# Patient Record
Sex: Female | Born: 1950 | Race: White | Hispanic: No | Marital: Single | Smoking: Former smoker
Health system: Southern US, Community
[De-identification: ages and names within clinical notes are randomized; demographics above are authoritative.]

## PROBLEM LIST (undated history)

## (undated) DIAGNOSIS — M810 Age-related osteoporosis without current pathological fracture: Secondary | ICD-10-CM

## (undated) DIAGNOSIS — F32A Depression, unspecified: Secondary | ICD-10-CM

## (undated) DIAGNOSIS — E785 Hyperlipidemia, unspecified: Secondary | ICD-10-CM

## (undated) DIAGNOSIS — F988 Other specified behavioral and emotional disorders with onset usually occurring in childhood and adolescence: Secondary | ICD-10-CM

## (undated) DIAGNOSIS — G629 Polyneuropathy, unspecified: Secondary | ICD-10-CM

## (undated) DIAGNOSIS — F329 Major depressive disorder, single episode, unspecified: Secondary | ICD-10-CM

## (undated) HISTORY — DX: Age-related osteoporosis without current pathological fracture: M81.0

## (undated) HISTORY — DX: Major depressive disorder, single episode, unspecified: F32.9

## (undated) HISTORY — DX: Depression, unspecified: F32.A

## (undated) HISTORY — DX: Hyperlipidemia, unspecified: E78.5

## (undated) HISTORY — PX: TONSILLECTOMY: SUR1361

## (undated) HISTORY — DX: Other specified behavioral and emotional disorders with onset usually occurring in childhood and adolescence: F98.8

## (undated) HISTORY — DX: Polyneuropathy, unspecified: G62.9

---

## 1999-05-21 ENCOUNTER — Encounter: Payer: Self-pay | Admitting: Family Medicine

## 1999-05-21 ENCOUNTER — Ambulatory Visit (HOSPITAL_COMMUNITY): Admission: RE | Admit: 1999-05-21 | Discharge: 1999-05-21 | Payer: Self-pay | Admitting: Family Medicine

## 1999-05-25 ENCOUNTER — Ambulatory Visit (HOSPITAL_COMMUNITY): Admission: RE | Admit: 1999-05-25 | Discharge: 1999-05-25 | Payer: Self-pay | Admitting: Family Medicine

## 1999-05-25 ENCOUNTER — Encounter: Payer: Self-pay | Admitting: Family Medicine

## 1999-07-28 ENCOUNTER — Encounter: Admission: RE | Admit: 1999-07-28 | Discharge: 1999-10-26 | Payer: Self-pay | Admitting: Anesthesiology

## 1999-08-19 ENCOUNTER — Encounter: Payer: Self-pay | Admitting: Neurosurgery

## 1999-08-19 ENCOUNTER — Ambulatory Visit (HOSPITAL_COMMUNITY): Admission: RE | Admit: 1999-08-19 | Discharge: 1999-08-19 | Payer: Self-pay | Admitting: Neurosurgery

## 1999-09-04 ENCOUNTER — Ambulatory Visit (HOSPITAL_COMMUNITY): Admission: RE | Admit: 1999-09-04 | Discharge: 1999-09-04 | Payer: Self-pay | Admitting: Neurosurgery

## 1999-09-10 ENCOUNTER — Encounter: Payer: Self-pay | Admitting: Neurosurgery

## 1999-09-10 ENCOUNTER — Ambulatory Visit (HOSPITAL_COMMUNITY): Admission: RE | Admit: 1999-09-10 | Discharge: 1999-09-10 | Payer: Self-pay | Admitting: Neurosurgery

## 1999-09-29 ENCOUNTER — Encounter: Payer: Self-pay | Admitting: Neurosurgery

## 1999-09-29 ENCOUNTER — Ambulatory Visit (HOSPITAL_COMMUNITY): Admission: RE | Admit: 1999-09-29 | Discharge: 1999-09-29 | Payer: Self-pay | Admitting: Neurosurgery

## 2000-09-07 ENCOUNTER — Encounter: Admission: RE | Admit: 2000-09-07 | Discharge: 2000-09-07 | Payer: Self-pay | Admitting: Family Medicine

## 2000-09-07 ENCOUNTER — Encounter: Payer: Self-pay | Admitting: Family Medicine

## 2002-06-28 ENCOUNTER — Encounter: Payer: Self-pay | Admitting: Emergency Medicine

## 2002-06-28 ENCOUNTER — Emergency Department (HOSPITAL_COMMUNITY): Admission: EM | Admit: 2002-06-28 | Discharge: 2002-06-28 | Payer: Self-pay | Admitting: Emergency Medicine

## 2003-01-03 ENCOUNTER — Encounter: Admission: RE | Admit: 2003-01-03 | Discharge: 2003-04-03 | Payer: Self-pay | Admitting: Neurology

## 2003-09-23 ENCOUNTER — Other Ambulatory Visit: Admission: RE | Admit: 2003-09-23 | Discharge: 2003-09-23 | Payer: Self-pay | Admitting: Family Medicine

## 2004-10-14 ENCOUNTER — Ambulatory Visit (HOSPITAL_COMMUNITY): Admission: RE | Admit: 2004-10-14 | Discharge: 2004-10-14 | Payer: Self-pay | Admitting: Family Medicine

## 2006-05-16 ENCOUNTER — Encounter: Admission: RE | Admit: 2006-05-16 | Discharge: 2006-05-24 | Payer: Self-pay | Admitting: Psychiatry

## 2006-05-16 ENCOUNTER — Ambulatory Visit: Payer: Self-pay | Admitting: Psychology

## 2010-03-22 ENCOUNTER — Encounter: Payer: Self-pay | Admitting: Family Medicine

## 2012-02-07 ENCOUNTER — Ambulatory Visit (INDEPENDENT_AMBULATORY_CARE_PROVIDER_SITE_OTHER): Payer: BC Managed Care – PPO | Admitting: Family Medicine

## 2012-02-07 ENCOUNTER — Ambulatory Visit: Payer: BC Managed Care – PPO

## 2012-02-07 VITALS — BP 103/63 | HR 77 | Temp 98.8°F | Resp 16 | Ht 65.5 in | Wt 155.0 lb

## 2012-02-07 DIAGNOSIS — M25531 Pain in right wrist: Secondary | ICD-10-CM

## 2012-02-07 DIAGNOSIS — S52599A Other fractures of lower end of unspecified radius, initial encounter for closed fracture: Secondary | ICD-10-CM

## 2012-02-07 DIAGNOSIS — S52509A Unspecified fracture of the lower end of unspecified radius, initial encounter for closed fracture: Secondary | ICD-10-CM

## 2012-02-07 DIAGNOSIS — M25539 Pain in unspecified wrist: Secondary | ICD-10-CM

## 2012-02-07 NOTE — Progress Notes (Signed)
Subjective: 61 year old lady who was in [place] for a wedding this weekend. Her daughter got married. After the wedding she slipped on a wet floor and fell back with her left hand and her. She has pain in her wrist. She was seen by a physician and everything was felt to be in place. It was suspicious for fracture. She chose to return to the [place] to get checked. She did wear a wrist splint.  Objective: Is intact. She is most tender over the distal radius and the carpal bones surrounding that.  Assessment: Suspicious for left wrist fracture  Plan: X-ray and proceed accordingly  [facility] reading (PRIMARY) by  Dr. Alwyn Ren Fractured distal radius, nondisplaced. I do not think it is articular, but will await radiologist reading on that.  Sugar tongs splint. Return one week.  Will change to a cast next week, and treat her for about 6 weeks.

## 2012-02-07 NOTE — Patient Instructions (Addendum)
Ice and elevate for the next several days to reduce swelling. Use the ice for about 15-20 minutes 4 times a day.  Ibuprofen 600 mg every 6 or 8 hours as needed for pain  Return if worse  Recheck on Monday the December 16 between 9 and 3

## 2012-02-14 ENCOUNTER — Ambulatory Visit: Payer: BC Managed Care – PPO

## 2012-02-14 ENCOUNTER — Ambulatory Visit (INDEPENDENT_AMBULATORY_CARE_PROVIDER_SITE_OTHER): Payer: BC Managed Care – PPO | Admitting: Family Medicine

## 2012-02-14 VITALS — BP 117/73 | HR 74 | Temp 98.0°F | Resp 17 | Ht 65.0 in | Wt 153.0 lb

## 2012-02-14 DIAGNOSIS — S62102A Fracture of unspecified carpal bone, left wrist, initial encounter for closed fracture: Secondary | ICD-10-CM

## 2012-02-14 DIAGNOSIS — S62109A Fracture of unspecified carpal bone, unspecified wrist, initial encounter for closed fracture: Secondary | ICD-10-CM

## 2012-02-14 NOTE — Patient Instructions (Addendum)
Appt has been made with Dr Mack Hook at [facility] 3:15 today for your wrist fracture.

## 2012-02-14 NOTE — Progress Notes (Signed)
Subjective Patient is here for recheck with regard to her wrist. She's had some irritation underneath the splint. She has had a little swelling in the fingers and discomfort. She took some pain medicine at home.  Objective: Tender in the distal left wrist. I removed the splint to get a good look. She has ecchymosis laterally along the forearm. No positional abnormalities neurovascular intact. Finger motion is a little stiff but normal.  [facility] reading (PRIMARY) by  Dr. Alwyn Ren fx unchanged.   Assessment: Fracture distal radius, possible joint involvement.  Plan: Gave her the option of seeing an orthopedist and she thinks she would prefer that, so we are sending her to see Dr. Mariel Sleet orthopedics. She is happy with that. Rewrapped her splint

## 2012-03-11 ENCOUNTER — Ambulatory Visit: Payer: BC Managed Care – PPO

## 2012-03-11 ENCOUNTER — Ambulatory Visit (INDEPENDENT_AMBULATORY_CARE_PROVIDER_SITE_OTHER): Payer: BC Managed Care – PPO | Admitting: Family Medicine

## 2012-03-11 VITALS — BP 120/72 | HR 85 | Temp 98.2°F | Resp 16 | Ht 65.0 in | Wt 151.0 lb

## 2012-03-11 DIAGNOSIS — M25532 Pain in left wrist: Secondary | ICD-10-CM

## 2012-03-11 DIAGNOSIS — M25539 Pain in unspecified wrist: Secondary | ICD-10-CM

## 2012-03-11 DIAGNOSIS — M25529 Pain in unspecified elbow: Secondary | ICD-10-CM

## 2012-03-11 DIAGNOSIS — M25522 Pain in left elbow: Secondary | ICD-10-CM

## 2012-03-11 NOTE — Progress Notes (Signed)
[facility]:  Office Visit  Chief Complaint:  Chief Complaint  Patient presents with  . Elbow Injury    Lt elbow pain- Woman fell on Pt's arm at movie theater today about 3:10pm    HPI: Jennifer Heath is a 62 y.o. female who complains of  Left elbow pain, wrist pain after a woman fell on her today, was at the movies at 3 pm. She was sitting when a lady tripped walking down the theater stairs and landed on her left side. She immediately started ahving elbow and wrist pain, also cannot move her fingers as well as she did yesterday. Now has numbness/tingling in her wrist and elbow pain.   Past Medical History  Diagnosis Date  . Depression    No past surgical history on file. History   Social History  . Marital Status: Married    Spouse Name: N/A    Number of Children: N/A  . Years of Education: N/A   Social History Main Topics  . Smoking status: Never Smoker   . Smokeless tobacco: None  . Alcohol Use: Yes  . Drug Use: No  . Sexually Active: No   Other Topics Concern  . None   Social History Narrative  . None   Family History  Problem Relation Age of Onset  . Cancer Mother   . Cancer Father   . Stroke Father   . Heart disease Father   . Cancer Brother   . Cancer Maternal Grandmother   . Heart disease Maternal Grandfather   . Heart disease Paternal Grandmother   . Cancer Paternal Grandfather    Allergies  Allergen Reactions  . Erythromycin    Prior to Admission medications   Medication Sig Start Date End Date Taking? Authorizing Provider  ibuprofen (ADVIL,MOTRIN) 400 MG tablet Take 400 mg by mouth as needed.   Yes Historical Provider, MD  lisdexamfetamine (VYVANSE) 40 MG capsule Take 40 mg by mouth every morning.    Historical Provider, MD     ROS: The patient denies fevers, chills, night sweats, unintentional weight loss, chest pain, palpitations, wheezing, dyspnea on exertion, nausea, vomiting, abdominal pain, dysuria, hematuria, melena,  numbness, weakness, or tingling.   All other systems have been reviewed and were otherwise negative with the exception of those mentioned in the HPI and as above.    PHYSICAL EXAM: Filed Vitals:   03/11/12 1701  BP: 120/72  Pulse: 85  Temp: 98.2 F (36.8 C)  Resp: 16   Filed Vitals:   03/11/12 1701  Height: 5\' 5"  (1.651 m)  Weight: 151 lb (68.493 kg)   Body mass index is 25.13 kg/(m^2).  General: Alert, no acute distress HEENT:  Normocephalic, atraumatic, oropharynx patent.  Cardiovascular:  Regular rate and rhythm, no rubs murmurs or gallops.  No Carotid bruits, radial pulse intact. No pedal edema.  Respiratory: Clear to auscultation bilaterally.  No wheezes, rales, or rhonchi.  No cyanosis, no use of accessory musculature GI: No organomegaly, abdomen is soft and non-tender, positive bowel sounds.  No masses. Skin: No rashes. Neurologic: Facial musculature symmetric. Psychiatric: Patient is appropriate throughout our interaction. Lymphatic: No cervical lymphadenopathy Musculoskeletal: Gait intact. Left  elbow-tender to palpation, 5/5 strength, sensation intact, decrease  ROM in all directions Left wrist-decrease ROM due to pain and also in brace, sensation intact, finger strength intact  LABS: No results found for this or any previous visit.   EKG/XRAY:   Primary read interpreted by [facility] Conley Rolls at [facility].  Elbow-No obvious dislocation. I do not see a posterior fat pad but she has severe pain and cannot supinate/pronate very well.? Occult radial head fx Wrist-distal radius fracture healing well, stable.    ASSESSMENT/PLAN: Encounter Diagnoses  Name Primary?  . Left elbow pain Yes  . Left wrist pain    ? Occult elbow fracture Currently nothing obvious Will await radiology results Sling, immobilize until get official xray report Tramdol and ibuprofen F/u with Dr. Maisie Fus in 1 week    Marcille Barman PHUONG, DO 03/11/2012 6:09 PM

## 2012-03-12 ENCOUNTER — Telehealth: Payer: Self-pay | Admitting: Family Medicine

## 2012-03-12 NOTE — Telephone Encounter (Signed)
Elbow and arm doing better. D/w pt official radiology results.

## 2013-01-12 ENCOUNTER — Encounter (HOSPITAL_COMMUNITY): Payer: Self-pay | Admitting: Emergency Medicine

## 2013-01-12 ENCOUNTER — Emergency Department (HOSPITAL_COMMUNITY)
Admission: EM | Admit: 2013-01-12 | Discharge: 2013-01-12 | Disposition: A | Discharge status: Home / Self Care | Payer: BC Managed Care – PPO | Attending: Emergency Medicine | Admitting: Emergency Medicine

## 2013-01-12 ENCOUNTER — Emergency Department (HOSPITAL_COMMUNITY): Payer: BC Managed Care – PPO

## 2013-01-12 DIAGNOSIS — Z79899 Other long term (current) drug therapy: Secondary | ICD-10-CM | POA: Insufficient documentation

## 2013-01-12 DIAGNOSIS — F3289 Other specified depressive episodes: Secondary | ICD-10-CM | POA: Insufficient documentation

## 2013-01-12 DIAGNOSIS — S82002A Unspecified fracture of left patella, initial encounter for closed fracture: Secondary | ICD-10-CM

## 2013-01-12 DIAGNOSIS — F329 Major depressive disorder, single episode, unspecified: Secondary | ICD-10-CM | POA: Insufficient documentation

## 2013-01-12 DIAGNOSIS — S92253A Displaced fracture of navicular [scaphoid] of unspecified foot, initial encounter for closed fracture: Secondary | ICD-10-CM | POA: Insufficient documentation

## 2013-01-12 DIAGNOSIS — S92251A Displaced fracture of navicular [scaphoid] of right foot, initial encounter for closed fracture: Secondary | ICD-10-CM

## 2013-01-12 DIAGNOSIS — Y939 Activity, unspecified: Secondary | ICD-10-CM | POA: Insufficient documentation

## 2013-01-12 DIAGNOSIS — Y929 Unspecified place or not applicable: Secondary | ICD-10-CM | POA: Insufficient documentation

## 2013-01-12 DIAGNOSIS — W010XXA Fall on same level from slipping, tripping and stumbling without subsequent striking against object, initial encounter: Secondary | ICD-10-CM | POA: Insufficient documentation

## 2013-01-12 DIAGNOSIS — S82009A Unspecified fracture of unspecified patella, initial encounter for closed fracture: Secondary | ICD-10-CM | POA: Insufficient documentation

## 2013-01-12 MED ORDER — IBUPROFEN 800 MG PO TABS
800.0000 mg | ORAL_TABLET | Freq: Once | ORAL | Status: AC
  Administered 2013-01-12: 800 mg via ORAL
  Filled 2013-01-12: qty 1

## 2013-01-12 MED ORDER — OXYCODONE-ACETAMINOPHEN 5-325 MG PO TABS: 2.0000 | ORAL_TABLET | ORAL | Status: DC | PRN

## 2013-01-12 NOTE — ED Notes (Signed)
Bed: WA06 Expected date:  Expected time:  Means of arrival:  Comments: EMS-left knee pain

## 2013-01-12 NOTE — ED Provider Notes (Signed)
CSN: [id number]     Arrival date & time 01/12/13  1047 History   First MD Initiated Contact with Patient 01/12/13 1051     Chief Complaint  Patient presents with  . Knee Injury   (Consider location/radiation/quality/duration/timing/severity/associated sxs/prior Treatment) Patient is a 62 y.o. female presenting with fall. The history is provided by the patient. No language interpreter was used.  Fall This is a new problem. The current episode started today. The problem occurs constantly. The problem has been unchanged. Associated symptoms include joint swelling and myalgias. Nothing aggravates the symptoms. She has tried nothing for the symptoms.  Pt slipped and fell on wet leaves.   Pt reports she hit her right knee.  Pt reports she turned her ankles.   Pt reports both ankles are sore now.    Past Medical History  Diagnosis Date  . Depression    No past surgical history on file. Family History  Problem Relation Age of Onset  . Cancer Mother   . Cancer Father   . Stroke Father   . Heart disease Father   . Cancer Brother   . Cancer Maternal Grandmother   . Heart disease Maternal Grandfather   . Heart disease Paternal Grandmother   . Cancer Paternal Grandfather    History  Substance Use Topics  . Smoking status: Never Smoker   . Smokeless tobacco: Not on file  . Alcohol Use: Yes   OB History   Grav Para Term Preterm Abortions TAB SAB Ect Mult Living                 Review of Systems  Musculoskeletal: Positive for joint swelling and myalgias.  All other systems reviewed and are negative.    Allergies  Erythromycin  Home Medications   Current Outpatient Rx  Name  Route  Sig  Dispense  Refill  . ibuprofen (ADVIL,MOTRIN) 400 MG tablet   Oral   Take 400 mg by mouth as needed.         Marland Kitchen lisdexamfetamine (VYVANSE) 40 MG capsule   Oral   Take 40 mg by mouth every morning.          SpO2 99% Physical Exam  Nursing note and vitals reviewed. Constitutional: She  is oriented to person, place, and time. She appears well-developed and well-nourished.  HENT:  Head: Normocephalic and atraumatic.  Eyes: Pupils are equal, round, and reactive to light.  Pulmonary/Chest: Effort normal.  Musculoskeletal: She exhibits tenderness.  Tender right knee,  Swollen,    Tender bilat ankles,  From,  nv and ns intact  Neurological: She is alert and oriented to person, place, and time. She has normal reflexes.  Skin: Skin is warm.  Psychiatric: She has a normal mood and affect.    ED Course  Procedures (including critical care time) Labs Review Labs Reviewed - No data to display Imaging Review No results found.  EKG Interpretation   None       MDM   1. Patella fracture, left, closed, initial encounter   2. Navicular fracture of ankle, right, closed, initial encounter    I  I reviewed xrays.   I spoke to Dr. Margreta Journey who will see pt next week.  He advised ice, imbolizer,  See him in office.   Pt also placed in aso.      Lonia Skinner [place], PA-C 01/12/13 1250

## 2013-01-12 NOTE — ED Provider Notes (Signed)
Medical screening examination/treatment/procedure(s) were performed by non-physician practitioner and as supervising physician I was immediately available for consultation/collaboration.  EKG Interpretation   None       Devoria Albe, MD, Armando Gang   Ward Givens, MD 01/12/13 [phone]

## 2013-01-12 NOTE — ED Notes (Signed)
PA at bedside.

## 2013-01-12 NOTE — ED Notes (Addendum)
Patient reports to ED for left knee injury s/t slipping on stairs covered in leaves, turning ankle, falling, and twisting left knee. Per EMS patient was able to limp onto stretcher. No swelling or obvious deformity visible.

## 2013-07-31 ENCOUNTER — Other Ambulatory Visit: Payer: Self-pay | Admitting: Family Medicine

## 2013-07-31 ENCOUNTER — Other Ambulatory Visit (HOSPITAL_COMMUNITY)
Admission: RE | Admit: 2013-07-31 | Discharge: 2013-07-31 | Disposition: A | Discharge status: Home / Self Care | Payer: BC Managed Care – PPO | Source: Ambulatory Visit | Attending: Family Medicine | Admitting: Family Medicine

## 2013-07-31 DIAGNOSIS — Z0000 Encounter for general adult medical examination without abnormal findings: Secondary | ICD-10-CM | POA: Insufficient documentation

## 2013-08-02 LAB — CYTOLOGY - PAP

## 2013-12-29 ENCOUNTER — Ambulatory Visit (INDEPENDENT_AMBULATORY_CARE_PROVIDER_SITE_OTHER): Payer: BC Managed Care – PPO | Admitting: Emergency Medicine

## 2013-12-29 VITALS — BP 105/68 | HR 109 | Temp 99.0°F | Resp 20 | Ht 65.0 in | Wt 150.4 lb

## 2013-12-29 DIAGNOSIS — J111 Influenza due to unidentified influenza virus with other respiratory manifestations: Secondary | ICD-10-CM

## 2013-12-29 DIAGNOSIS — Z881 Allergy status to other antibiotic agents status: Secondary | ICD-10-CM

## 2013-12-29 DIAGNOSIS — R69 Illness, unspecified: Principal | ICD-10-CM

## 2013-12-29 MED ORDER — OSELTAMIVIR PHOSPHATE 75 MG PO CAPS: 75.0000 mg | ORAL_CAPSULE | Freq: Two times a day (BID) | ORAL | Status: DC

## 2013-12-29 MED ORDER — PROMETHAZINE-CODEINE 6.25-10 MG/5ML PO SYRP: 5.0000 mL | ORAL_SOLUTION | ORAL | Status: DC | PRN

## 2013-12-29 NOTE — Patient Instructions (Signed)

## 2013-12-29 NOTE — Progress Notes (Signed)
[facility] [address], [place] [id number] [phone]  Date:  12/29/2013   Name:  Jennifer Heath   DOB:  [birth year]   MRN:  [id number]  PCP:  No primary provider on file.    Chief Complaint: Influenza   History of Present Illness:  Jennifer Heath is a 63 y.o. very pleasant female patient who presents with the following:  Ill since Thursday night and has fever and chills that was sudden onset.  Has a cough, no wheezing but some exertional shortness of breath.   Some nasal congestion with clear drainage Fever to 102.    Sore throat and hoarseness No nausea or vomiting No stool change Has a generalized erythematous rash secondary to treatment of paronychia with keflex. No improvement with over the counter medications or other home remedies. Denies other complaint or health concern today.   There are no active problems to display for this patient.   Past Medical History  Diagnosis Date  . Depression     No past surgical history on file.  History  Substance Use Topics  . Smoking status: Never Smoker   . Smokeless tobacco: Not on file  . Alcohol Use: Yes    Family History  Problem Relation Age of Onset  . Cancer Mother   . Cancer Father   . Stroke Father   . Heart disease Father   . Cancer Brother   . Cancer Maternal Grandmother   . Heart disease Maternal Grandfather   . Heart disease Paternal Grandmother   . Cancer Paternal Grandfather     Allergies  Allergen Reactions  . Azithromycin Hives  . Erythromycin Nausea Only    Medication list has been reviewed and updated.  Current Outpatient Prescriptions on File Prior to Visit  Medication Sig Dispense Refill  . ibuprofen (ADVIL,MOTRIN) 400 MG tablet Take 400 mg by mouth as needed.      Marland Kitchen. lisdexamfetamine (VYVANSE) 40 MG capsule Take 40 mg by mouth every morning.       No current facility-administered medications on file prior to visit.    Review of Systems:  As per HPI,  otherwise negative.    Physical Examination: Filed Vitals:   12/29/13 1324  BP: 105/68  Pulse: 109  Temp: 99 F (37.2 C)  Resp: 20   Filed Vitals:   12/29/13 1324  Height: 5\' 5"  (1.651 m)  Weight: 150 lb 6.4 oz (68.221 kg)   Body mass index is 25.03 kg/(m^2). Ideal Body Weight: Weight in (lb) to have BMI = 25: 149.9  GEN: WDWN, NAD, Non-toxic, A & O x 3 HEENT: Atraumatic, Normocephalic. Neck supple. No masses, No LAD. Ears and Nose: No external deformity. CV: RRR, No M/G/R. No JVD. No thrill. No extra heart sounds. PULM: CTA B, no wheezes, crackles, rhonchi. No retractions. No resp. distress. No accessory muscle use. ABD: S, NT, ND, +BS. No rebound. No HSM. EXTR: No c/c/e NEURO Normal gait.  PSYCH: Normally interactive. Conversant. Not depressed or anxious appearing.  Calm demeanor.  SKIN: urticaria   Assessment and Plan: Allergic reaction to keflex Influenza like illness tamiflu Benadryl Phen c cod  Signed,  Phillips OdorJeffery Marston Mccadden, MD

## 2014-08-26 ENCOUNTER — Other Ambulatory Visit: Payer: Self-pay

## 2015-07-25 ENCOUNTER — Ambulatory Visit (INDEPENDENT_AMBULATORY_CARE_PROVIDER_SITE_OTHER): Payer: Medicare Other | Admitting: Emergency Medicine

## 2015-07-25 VITALS — BP 122/72 | HR 80 | Temp 97.4°F | Resp 17 | Ht 64.5 in | Wt 154.0 lb

## 2015-07-25 DIAGNOSIS — R221 Localized swelling, mass and lump, neck: Secondary | ICD-10-CM | POA: Diagnosis not present

## 2015-07-25 DIAGNOSIS — W57XXXA Bitten or stung by nonvenomous insect and other nonvenomous arthropods, initial encounter: Secondary | ICD-10-CM

## 2015-07-25 DIAGNOSIS — S1086XA Insect bite of other specified part of neck, initial encounter: Secondary | ICD-10-CM | POA: Diagnosis not present

## 2015-07-25 LAB — POCT CBC
Granulocyte percent: 64.9 %G (ref 37–80)
HCT, POC: 37.8 % (ref 37.7–47.9)
Hemoglobin: 13.3 g/dL (ref 12.2–16.2)
Lymph, poc: 2.5 (ref 0.6–3.4)
MCH, POC: 31.2 pg (ref 27–31.2)
MCHC: 35.2 g/dL (ref 31.8–35.4)
MCV: 88.7 fL (ref 80–97)
MID (CBC): 0.4 (ref 0–0.9)
MPV: 7.1 fL (ref 0–99.8)
POC Granulocyte: 5.3 (ref 2–6.9)
POC LYMPH %: 30.3 % (ref 10–50)
POC MID %: 4.8 %M (ref 0–12)
Platelet Count, POC: 280 10*3/uL (ref 142–424)
RBC: 4.26 M/uL (ref 4.04–5.48)
RDW, POC: 13.4 %
WBC: 8.2 10*3/uL (ref 4.6–10.2)

## 2015-07-25 MED ORDER — BETAMETHASONE DIPROPIONATE AUG 0.05 % EX CREA: TOPICAL_CREAM | Freq: Two times a day (BID) | CUTANEOUS | Status: DC

## 2015-07-25 NOTE — Progress Notes (Deleted)
    MRN: [id number] DOB: 1951-02-11  Subjective:   Jennifer Heath is a 65 y.o. female presenting for chief complaint of Insect Bite     Jennifer Heath has a current medication list which includes the following prescription(s): ibuprofen, lisdexamfetamine, and pravastatin. Also is allergic to azithromycin and erythromycin.  Jennifer Heath  has a past medical history of Depression. Also  has no past surgical history on file.  Objective:   Vitals: BP 122/72 mmHg  Pulse 80  Temp(Src) 97.4 F (36.3 C) (Oral)  Resp 17  Ht 5' 4.5" (1.638 m)  Wt 154 lb (69.854 kg)  BMI 26.04 kg/m2  SpO2 99%  Physical Exam  No results found for this or any previous visit (from the past 24 hour(s)).  Assessment and Plan :     Wallis BambergMario Tandrea Kommer, PA-C [facility] [phone] 07/25/2015 10:18 AM

## 2015-07-25 NOTE — Progress Notes (Signed)
By signing my name below, I, Raven Small, attest that this documentation has been prepared under the direction and in the presence of Lesle Chris, MD.  Electronically Signed: Andrew Au, ED Scribe. 07/25/2015. 10:47 AM.  Chief Complaint:  Chief Complaint  Patient presents with  . Insect Bite    on neck     HPI: Jennifer Heath is a 65 y.o. female who reports to [facility] today complaining of insect bite to neck noticed about 1 week ago. Pt states she had been working in the yard. She states she had swelling the to neck the size of a ping pong ball with itching but no redness. She still has some swelling but has improved since last week. She also reports headache for about 1 week since noticing the bite. She denies applying cream or ointment to area. She denies fever and chills.    Past Medical History  Diagnosis Date  . Depression    No past surgical history on file. Social History   Social History  . Marital Status: Married    Spouse Name: N/A  . Number of Children: N/A  . Years of Education: N/A   Social History Main Topics  . Smoking status: Never Smoker   . Smokeless tobacco: None  . Alcohol Use: Yes  . Drug Use: No  . Sexual Activity: No   Other Topics Concern  . None   Social History Narrative   Family History  Problem Relation Age of Onset  . Cancer Mother   . Cancer Father   . Stroke Father   . Heart disease Father   . Cancer Brother   . Cancer Maternal Grandmother   . Heart disease Maternal Grandfather   . Heart disease Paternal Grandmother   . Cancer Paternal Grandfather    Allergies  Allergen Reactions  . Azithromycin Hives  . Erythromycin Nausea Only   Prior to Admission medications   Medication Sig Start Date End Date Taking? Authorizing Provider  ibuprofen (ADVIL,MOTRIN) 400 MG tablet Take 400 mg by mouth as needed.   Yes Historical Provider, MD  lisdexamfetamine (VYVANSE) 40 MG capsule Take 40 mg by mouth every morning.   Yes Historical  Provider, MD  pravastatin (PRAVACHOL) 10 MG tablet Take 10 mg by mouth daily.   Yes Historical Provider, MD     ROS: The patient denies fevers, chills, night sweats, unintentional weight loss, chest pain, palpitations, wheezing, dyspnea on exertion, nausea, vomiting, abdominal pain, dysuria, hematuria, melena, numbness, weakness, or tingling.   All other systems have been reviewed and were otherwise negative with the exception of those mentioned in the HPI and as above.    PHYSICAL EXAM: Filed Vitals:   07/25/15 0928  BP: 122/72  Pulse: 80  Temp: 97.4 F (36.3 C)  Resp: 17   Body mass index is 26.04 kg/(m^2).   General: Alert, no acute distress HEENT:  Normocephalic, atraumatic, oropharynx patent. No nodes. No masses.  Eye: Nonie Hoyer [facility] Cardiovascular:  Regular rate and rhythm, no rubs murmurs or gallops.  No Carotid bruits, radial pulse intact. No pedal edema.  Respiratory: Clear to auscultation bilaterally.  No wheezes, rales, or rhonchi.  No cyanosis, no use of accessory musculature Abdominal: No organomegaly, abdomen is soft and non-tender, positive bowel sounds.  No masses. Musculoskeletal: Gait intact. No edema, tenderness Skin: No rashes. Very mild redness with swelling on the left side of the neck.  Neurologic: Facial musculature symmetric. Psychiatric: Patient acts appropriately throughout our interaction. Lymphatic: No  cervical or submandibular lymphadenopathy   LABS: Results for orders placed or performed in visit on 07/25/15  POCT CBC  Result Value Ref Range   WBC 8.2 4.6 - 10.2 K/uL   Lymph, poc 2.5 0.6 - 3.4   POC LYMPH PERCENT 30.3 10 - 50 %L   MID (cbc) 0.4 0 - 0.9   POC MID % 4.8 0 - 12 %M   POC Granulocyte 5.3 2 - 6.9   Granulocyte percent 64.9 37 - 80 %G   RBC 4.26 4.04 - 5.48 M/uL   Hemoglobin 13.3 12.2 - 16.2 g/dL   HCT, POC 40.937.8 81.137.7 - 47.9 %   MCV 88.7 80 - 97 fL   MCH, POC 31.2 27 - 31.2 pg   MCHC 35.2 31.8 - 35.4 g/dL   RDW, POC 91.413.4 %    Platelet Count, POC 280 142 - 424 K/uL   MPV 7.1 0 - 99.8 fL   ASSESSMENT/PLAN: Patient having a prolonged reaction to an insect bite. I advised her to take Zyrtec one daily. She will apply Diprolene cream 3 times a day.I personally performed the services described in this documentation, which was scribed in my presence. The recorded information has been reviewed and is accurate.   Gross sideeffects, risk and benefits, and alternatives of medications d/w patient. Patient is aware that all medications have potential sideeffects and we are unable to predict every sideeffect or drug-drug interaction that may occur.  Lesle ChrisSteven Havah Ammon MD 07/25/2015 10:47 AM

## 2015-07-25 NOTE — Patient Instructions (Addendum)
Apply cream twice a day. Take Zyrtec one daily for itching.    IF you received an x-ray today, you will receive an invoice from [facility]. Please contact [facility] at [phone] with questions or concerns regarding your invoice.   IF you received labwork today, you will receive an invoice from [facility]. Please contact [facility] at [phone] with questions or concerns regarding your invoice.   Our billing staff will not be able to assist you with questions regarding bills from these companies.  You will be contacted with the lab results as soon as they are available. The fastest way to get your results is to activate your My Chart account. Instructions are located on the last page of this paperwork. If you have not heard from us regarding the results in 2 weeks, please contact this office.

## 2015-08-19 ENCOUNTER — Ambulatory Visit (INDEPENDENT_AMBULATORY_CARE_PROVIDER_SITE_OTHER): Payer: Medicare Other | Admitting: Neurology

## 2015-08-19 ENCOUNTER — Encounter: Payer: Self-pay | Admitting: Neurology

## 2015-08-19 VITALS — BP 107/69 | HR 83 | Ht 64.5 in | Wt 152.4 lb

## 2015-08-19 DIAGNOSIS — F411 Generalized anxiety disorder: Secondary | ICD-10-CM | POA: Diagnosis not present

## 2015-08-19 DIAGNOSIS — F909 Attention-deficit hyperactivity disorder, unspecified type: Secondary | ICD-10-CM

## 2015-08-19 DIAGNOSIS — R42 Dizziness and giddiness: Secondary | ICD-10-CM | POA: Diagnosis not present

## 2015-08-19 DIAGNOSIS — F05 Delirium due to known physiological condition: Secondary | ICD-10-CM

## 2015-08-19 DIAGNOSIS — R4789 Other speech disturbances: Secondary | ICD-10-CM

## 2015-08-19 DIAGNOSIS — F988 Other specified behavioral and emotional disorders with onset usually occurring in childhood and adolescence: Secondary | ICD-10-CM | POA: Insufficient documentation

## 2015-08-19 DIAGNOSIS — F329 Major depressive disorder, single episode, unspecified: Secondary | ICD-10-CM | POA: Diagnosis not present

## 2015-08-19 DIAGNOSIS — F32A Depression, unspecified: Secondary | ICD-10-CM

## 2015-08-19 DIAGNOSIS — R413 Other amnesia: Secondary | ICD-10-CM | POA: Diagnosis not present

## 2015-08-19 DIAGNOSIS — R410 Disorientation, unspecified: Secondary | ICD-10-CM

## 2015-08-19 NOTE — Progress Notes (Signed)
[facility]    Provider:  Dr Lucia GaskinsAhern Referring Provider: Merri BrunetteSmith, Candace, MD Primary Care Physician:  Merri BrunetteSmith, Candace  CC:  Atrophy of the brain  HPI:  Jennifer Heath is a 65 y.o. female here as a referral from Dr. Katrinka BlazingSmith for history of abnormal MRI in the past showing "brain shrinkage" per patient. PMHx hld, ADD, memory changes, fatigue. She reports she hit her head in the past, she was having symptoms of cognitive changes and she was told she had a shrunken brain after MRI and evaluation in our office. She cried when she heard that and recently a friend died of dementia and she remembered the MRI and wanted to follow up. She was asked in the past about alcohol use. She does not have a significant history of alcohol use. This worries her. She found some documents some office notes when she saw our practice in 2004 and brought them with her(reviewed below). A close friend passed away recently, from dementia. She is worried about dementia. She is having memory problems which have been ongoing for years. She also has depression. She has been driving back and forth to [place], that has been stressful, staying with daughter and son in law is hard. "Everything is hard", such as decision making process. She is moving slower. She jogs every morning and she is slower at home. She is not able to express herself. She has to go back and see the news a few times, she can;t absorb information as well. She lives alone, she is worried about dementia. In the last year, she is having more difficulty with finances, she is in debt on her credit, she is missing bills. She has been dizzy as well. She had neurocognitive testing in the past in 2004.   Reviewed notes, labs and imaging from outside physicians, which showed:  Reviewed labs from pcp, CMP normal with creatinine 0.77 08/07/2015, cbc normal. b12 443, tsh 2.25, ldl 106, hgba1c 5.4.  Reviewed pcp notes. She has a history of ADD on vyvanse. In  2003/2004 she fell down and had head injury and MRi showed "brain shrinkage", father with dementia, she has dizziness and fatigue, harder time with trouble thinking, processing speed, trouble putting things into words. Reviewed EPIC, could not find any previous brain imaging or notes from neurology, looked in old system centricity also no notes. Reviewed notes in EPIC, none significant for chief complaint, was seen in the ED 12/2012 for left knee injury, in 11/2013 for the flu, in 06/2015 for an insect bite, no mention of memory changes or concerns from physicians seeing her.   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 Patient was seen in September 2004 for intermittent dizziness as well as cognitive impairment since a closed head injury on 06/28/2002. Patient had fallen down at home rolled over 6 steps and landed at the base in the back of her head. She did not lose consciousness or have any skull fracture or bleeding. Since then she's been describing dizziness. Intermittent vertigo as well particularly when turning her head when bending down to pick up objects. CAT scan was  Unremarkable.Meclizine made her feel sleepy and drug. They also tried maneuvers in the office which only helped her a little bit. She also reported cognitive difficulties since the accident increased difficulty with analyzing information,poor concentration as well as multitasking, short-term memory also quite poor.  Even prior to the accident she was concerned that she had mild attention deficit problems, as well as depression for which she was treated on Wellbutrin. Concerta helped her. 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 She continued to have slow thinking, poor memory as well as depression. Vestibular therapy was recommended.3 months later she was seen again with intermittent dizziness, not compliant with home vestibular stabilization exercises, continued to have cognitive difficulty predominantly related to short-term memory problems.She was referred for neuropsych testing. Review of Systems: Patient complains of symptoms per HPI as well as the following symptoms: fatigue, hearing loss, ringing in ears, memory loss, headache, dizziness, depression, not enough sleep, decreased energy.  Pertinent negatives per HPI. All others negative.   Social History   Social History  . Marital Status: Single    Spouse Name: N/A  . Number of Children: 2  . Years of Education: 16   Occupational History  . 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 Smoking status: Former Smoker    Quit date: 03/02/1979  . Smokeless tobacco: Not on file  . Alcohol Use: 0.0 oz/week    0 Standard drinks or equivalent per week  . Drug Use: No  . Sexual Activity: No   Other Topics Concern  . Not on file   Social History Narrative   Lives alone   Caffeine use: very little    Family History  Problem Relation Age of Onset  . Cancer Mother   . Cancer Father   . Stroke Father   . Heart disease Father   . Cancer Brother   . Cancer Maternal Grandmother   . Heart disease Maternal Grandfather   . Heart disease Paternal Grandmother   . Cancer Paternal Grandfather   . Dementia Neg Hx     Past Medical History  Diagnosis Date  . Depression   . ADD (attention deficit disorder)   . Osteoporosis   . Hyperlipidemia     Past Surgical History  Procedure Laterality Date  . Tonsillectomy      Current Outpatient  Prescriptions  Medication Sig Dispense Refill  . augmented betamethasone dipropionate (DIPROLENE AF) 0.05 % cream Apply topically 2 (two) times daily. 30 g 0  . ibuprofen (ADVIL,MOTRIN) 400 MG tablet Take 400 mg by mouth as needed.    Marland Kitchen lisdexamfetamine (VYVANSE) 40 MG capsule Take 40 mg by mouth every morning.    . pravastatin (PRAVACHOL) 20 MG tablet Take 20 mg by mouth daily.     No current facility-administered medications for this visit.    Allergies as of 08/19/2015 - Review Complete 08/19/2015  Allergen Reaction Noted  . Azithromycin Hives 01/12/2013  . Erythromycin Nausea Only 02/07/2012    Vitals: BP 107/69 mmHg  Pulse 83  Ht 5' 4.5" (1.638 m)  Wt 152 lb 6.4 oz (69.128 kg)  BMI 25.76 kg/m2 Last Weight:  Wt Readings from Last 1 Encounters:  08/19/15 152 lb 6.4 oz (69.128 kg)   Last Height:   Ht Readings from Last 1 Encounters:  08/19/15 5' 4.5" (1.638 m)   Physical exam: Exam: Gen: NAD, conversant, well nourised, well groomed  CV: RRR, no MRG. No Carotid Bruits. No peripheral edema, warm, nontender Eyes: Conjunctivae clear without exudates or hemorrhage  Neuro: Detailed Neurologic Exam  Speech:    Speech is normal; fluent and spontaneous with normal comprehension.  Cognition:    The patient is oriented to person, place, and time;     recent and remote memory intact;     language fluent;     normal attention, concentration,     fund of knowledge Cranial Nerves:    The pupils are equal, round, and reactive to light. The fundi are normal and spontaneous venous pulsations are present. Visual fields are full to finger confrontation. Extraocular movements are intact. Trigeminal sensation is intact and the muscles of mastication are normal. The face is symmetric. The palate elevates in the midline. Hearing intact. Voice is normal. Shoulder shrug is normal. The tongue has normal motion without fasciculations.   Coordination:    Normal finger to  nose and heel to shin. Normal rapid alternating movements.   Gait:    Heel-toe and tandem gait are normal.   Motor Observation:    No asymmetry, no atrophy, and no involuntary movements noted. Tone:    Normal muscle tone.    Posture:    Posture is normal. normal erect    Strength:    Strength is V/V in the upper and lower limbs.      Sensation: intact to LT     Reflex Exam:  DTR's:    Deep tendon reflexes in the upper and lower extremities are normal bilaterally.   Toes:    The toes are downgoing bilaterally.   Clonus:    Clonus is absent.       Assessment/Plan:   Jennifer Heath is a 65 y.o. female here as a referral from Dr. Katrinka Blazing for history of abnormal MRI in the past maybe showing atrophy(cannot find the images or a report). PMHx hld, ADD, memory changes, fatigue, depression and anxiety. Patient is reporting what appears to be a long history  of perceived cognitive problems. She was diagnosed in 2004 with cognitive changes associated with post-concussive Syndrome with a  contribution from her depression and attention disorder.   MMSE 30/30 today. Patient appears to have a long history of perceived cognitive disorder, likely multifactorial due to mood and attention deficit disorder,Normal cognitive aging, anxiety - very unlikely degenerative neurocognitive disease. Will repeat MRI of the brain. Neurocognitive testing ordered  Naomie Dean, MD  [facility] [address] [place], [place] [address]  Phone [phone] Fax [phone]

## 2015-08-19 NOTE — Patient Instructions (Signed)
Remember to drink plenty of fluid, eat healthy meals and do not skip any meals. Try to eat protein with a every meal and eat a healthy snack such as fruit or nuts in between meals. Try to keep a regular sleep-wake schedule and try to exercise daily, particularly in the form of walking, 20-30 minutes a day, if you can.   As far as diagnostic testing: MRI of the brain, Neurocognitive testing.   I would like to see you back after results, sooner if we need to. Please call us with any interim questions, concerns, problems, updates or refill requests.   Our phone number is [phone]. We also have an after hours call service for urgent matters and there is a physician on-call for urgent questions. For any emergencies you know to call 911 or go to the nearest emergency room

## 2015-11-20 ENCOUNTER — Ambulatory Visit
Admission: RE | Admit: 2015-11-20 | Discharge: 2015-11-20 | Disposition: A | Discharge status: Home / Self Care | Payer: Medicare Other | Source: Ambulatory Visit | Attending: Neurology | Admitting: Neurology

## 2015-11-20 DIAGNOSIS — R410 Disorientation, unspecified: Secondary | ICD-10-CM

## 2015-11-20 DIAGNOSIS — R413 Other amnesia: Secondary | ICD-10-CM | POA: Diagnosis not present

## 2015-11-20 DIAGNOSIS — F05 Delirium due to known physiological condition: Secondary | ICD-10-CM

## 2015-11-20 DIAGNOSIS — R4789 Other speech disturbances: Secondary | ICD-10-CM

## 2015-11-20 DIAGNOSIS — R42 Dizziness and giddiness: Secondary | ICD-10-CM

## 2015-11-24 ENCOUNTER — Telehealth: Payer: Self-pay | Admitting: *Deleted

## 2015-11-24 NOTE — Telephone Encounter (Signed)
Called and spoke to patient about MRI results. She verbalized understanding.

## 2015-11-24 NOTE — Telephone Encounter (Signed)
-----   Message from Anson FretAntonia B Ahern, MD sent at 11/23/2015  7:18 PM EDT ----- MRI of her brain is normal.  Patient was told she had brain atrophy in the past but her brain is normal and I do not see any evidence for premature atrophy. thanks

## 2015-11-26 ENCOUNTER — Telehealth: Payer: Self-pay | Admitting: Neurology

## 2015-11-26 NOTE — Telephone Encounter (Signed)
Patient called, states she saw her PCP today, Dr. Merri Brunetteandace Smith who advised to follow up with neurologist for Raynaud's Phenomenon. Please call [phone].

## 2015-11-26 NOTE — Telephone Encounter (Signed)
LVM returning pt call. Please relay message below per Dr Lucia GaskinsAhern. She can request referral from PCP to rheumatologist.

## 2015-11-26 NOTE — Telephone Encounter (Signed)
Per Dr Lucia GaskinsAhern- she does not treat this. She would have to be evaluated by rheumatology.

## 2015-11-27 NOTE — Telephone Encounter (Signed)
Dr Lucia GaskinsAhern- FYI  Called and spoke to patient. Advised per Dr Lucia GaskinsAhern that she does not treat this. Dr Lucia GaskinsAhern recommends she request referral to Rheumatology from PCP.   Pt stated she looked this up last night and does not feel she has this. She states she has pain in her toes. PCP does not think she is diabetic. Advised patient to call PCP and discuss this with her to determine if she wants to refer her to us or rheumatology. She verbalized understanding.

## 2015-12-24 ENCOUNTER — Ambulatory Visit
Admission: RE | Admit: 2015-12-24 | Discharge: 2015-12-24 | Disposition: A | Discharge status: Home / Self Care | Payer: Medicare Other | Source: Ambulatory Visit | Attending: Family Medicine | Admitting: Family Medicine

## 2015-12-24 ENCOUNTER — Other Ambulatory Visit: Payer: Self-pay | Admitting: Family Medicine

## 2015-12-24 DIAGNOSIS — Z01810 Encounter for preprocedural cardiovascular examination: Secondary | ICD-10-CM

## 2015-12-24 NOTE — Telephone Encounter (Addendum)
Pt called back said PCP advised her to call neurologist to have "test" for nerve damage. She has not requested to see rheumatologist. She is asking for RN to call her  Please call after 3pm today

## 2015-12-24 NOTE — Telephone Encounter (Signed)
Per Dr Lucia GaskinsAhern- she can come tomorrow at 1115am for f/u appt with her.

## 2015-12-24 NOTE — Telephone Encounter (Signed)
Called and spoke to pt on home number. Made f/u tomorrow at 1115am, check in 11am per Dr Lucia GaskinsAhern ok. Pt verbalized understanding.  Advised I LVM on mobile also and to disregard this.

## 2015-12-25 ENCOUNTER — Encounter: Payer: Self-pay | Admitting: Neurology

## 2015-12-25 ENCOUNTER — Ambulatory Visit (INDEPENDENT_AMBULATORY_CARE_PROVIDER_SITE_OTHER): Payer: Medicare Other | Admitting: Neurology

## 2015-12-25 VITALS — BP 121/52 | HR 78 | Ht 64.5 in | Wt 145.0 lb

## 2015-12-25 DIAGNOSIS — E519 Thiamine deficiency, unspecified: Secondary | ICD-10-CM

## 2015-12-25 DIAGNOSIS — G629 Polyneuropathy, unspecified: Secondary | ICD-10-CM | POA: Diagnosis not present

## 2015-12-25 DIAGNOSIS — E531 Pyridoxine deficiency: Secondary | ICD-10-CM

## 2015-12-25 NOTE — Progress Notes (Signed)
[facility]   Provider:  Dr Lucia Gaskins Referring Provider: [name], MD Primary Care Physician:  [name]  CC:  Paresthesias in fingers and toes  Interval history 12/25/2015:   MRI of the brain 10/2015 was normal. Appropriate cerebral volume, no indication of "brain shrinkage" as patient has been told in the past.  Today she returns with paresthesias. She has pains in the feet and hands, she does not have diabetes and her thyroid and b12 have been tested. She noticed it 2 years ago in the setting of stress worsening when she ate badly, worsening since September, numbness in the toes like someone is a pin in her big toe, affected by what she ate, worse at night, will perform an emg/ncs of one arm and one leg. The fingers are more sensitive, all the fingers a slight tingling. Father had diabetes.   HPI:  Jennifer Heath is a 65 y.o. female here as a referral from Dr. Katrinka Blazing for history of abnormal MRI in the past showing "brain shrinkage" per patient. PMHx hld, ADD, memory changes, fatigue. She reports she hit her head in the past, she was having symptoms of cognitive changes and she was told she had a shrunken brain after MRI and evaluation in our office. She cried when she heard that and recently a friend died of dementia and she remembered the MRI and wanted to follow up. She was asked in the past about alcohol use. She does not have a significant history of alcohol use. This worries her. She found some documents some office notes when she saw our practice in 2004 and brought them with her(reviewed below). A close friend passed away recently, from dementia. She is worried about dementia. She is having memory problems which have been ongoing for years. She also has depression. She has been driving back and forth to [place], that has been stressful, staying with daughter and son in law is hard. "Everything is hard", such as decision making process. She is moving slower. She  jogs every morning and she is slower at home. She is not able to express herself. She has to go back and see the news a few times, she can;t absorb information as well. She lives alone, she is worried about dementia. In the last year, she is having more difficulty with finances, she is in debt on her credit, she is missing bills. She has been dizzy as well. She had neurocognitive testing in the past in 2004.   Reviewed notes, labs and imaging from outside physicians, which showed:  Reviewed labs from pcp, CMP normal with creatinine 0.77 08/07/2015, cbc normal. b12 443, tsh 2.25, ldl 106, hgba1c 5.4.  Reviewed pcp notes. She has a history of ADD on vyvanse. In 2003/2004 she fell down and had head injury and MRi showed "brain shrinkage", father with dementia, she has dizziness and fatigue, harder time with trouble thinking, processing speed, trouble putting things into words. Reviewed EPIC, could not find any previous brain imaging or notes from neurology, looked in old system centricity also no notes. Reviewed notes in EPIC, none significant for chief complaint, was seen in the ED 12/2012 for left knee injury, in 11/2013 for the flu, in 06/2015 for an insect bite, no mention of memory changes or concerns from physicians seeing her.   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 Patient was seen  in September 2004 for intermittent dizziness as well as cognitive impairment since a closed head injury on 06/28/2002. Patient had fallen down at home rolled over 6 steps and landed at the base in the back of her head. She did not lose consciousness or have any skull fracture or bleeding. Since then she's been describing dizziness. Intermittent vertigo as well particularly when turning her head when bending down to pick up objects. CAT scan  was  Unremarkable.Meclizine made her feel sleepy and drug. They also tried maneuvers in the office which only helped her a little bit. She also reported cognitive difficulties since the accident increased difficulty with analyzing information,poor concentration as well as multitasking, short-term memory also quite poor. Even prior to the accident she was concerned that she had mild attention deficit problems, as well as depression for which she was treated on Wellbutrin. Concerta helped her. 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 She continued to have slow thinking, poor memory as well as depression. Vestibular therapy was recommended.3 months later she was seen again with intermittent dizziness, not compliant with home vestibular stabilization exercises, continued to have cognitive difficulty predominantly related to short-term memory problems.She was referred for neuropsych testing.  Review of Systems: Patient complains of symptoms per HPI as well as the following symptoms: fatigue, hearing loss, ringing in ears, memory loss, headache, dizziness, depression, not enough sleep, decreased energy.  Pertinent negatives per HPI. All others negative.    Social History   Social History  . Marital status: Single    Spouse name: N/A  . Number of children: 2  . Years of education: 16   Occupational History  . 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 Smoking status: Former Smoker    Quit date: 03/02/1979  . Smokeless tobacco: Never Used  . Alcohol use 0.0 oz/week  . Drug use: No  . Sexual activity: No   Other Topics Concern  . Not on file    Social History Narrative   Lives alone   Caffeine use: very little    Family History  Problem Relation Age of Onset  . Cancer Mother   . Cancer Father   . Stroke Father   . Heart disease Father   . Cancer Brother   . Cancer Maternal Grandmother   . Heart disease Maternal Grandfather   . Heart disease Paternal Grandmother   . Cancer Paternal Grandfather   . Dementia Neg Hx     Past Medical History:  Diagnosis Date  . ADD (attention deficit disorder)   . Depression   . Hyperlipidemia   . Osteoporosis     Past Surgical History:  Procedure Laterality Date  . TONSILLECTOMY      Current Outpatient Prescriptions  Medication Sig Dispense Refill  . ibuprofen (ADVIL,MOTRIN) 400 MG tablet Take 400 mg by mouth as needed.    Marland Kitchen lisdexamfetamine (VYVANSE) 40 MG capsule Take 40 mg by mouth every morning.    . pravastatin (PRAVACHOL) 20 MG tablet Take 20 mg by mouth daily.     No current facility-administered medications for this visit.     Allergies as of 12/25/2015 - Review Complete 12/25/2015  Allergen Reaction Noted  .  Azithromycin Hives 01/12/2013  . Erythromycin Nausea Only 02/07/2012    Vitals: BP (!) 121/52 (BP Location: Right Arm, Patient Position: Sitting, Cuff Size: Normal)   Pulse 78   Ht 5' 4.5" (1.638 m)   Wt 145 lb (65.8 kg)   BMI 24.50 kg/m  Last Weight:  Wt Readings from Last 1 Encounters:  12/25/15 145 lb (65.8 kg)   Last Height:   Ht Readings from Last 1 Encounters:  12/25/15 5' 4.5" (1.638 m)     Physical exam: Exam: Gen: NAD, conversant, well nourised, well groomed                     CV: RRR, no MRG. No Carotid Bruits. No peripheral edema, warm, nontender Eyes: Conjunctivae clear without exudates or hemorrhage  Neuro: Detailed Neurologic Exam  Speech:    Speech is normal; fluent and spontaneous with normal comprehension.  Cognition:    The patient is oriented to person, place, and time;     recent and remote memory intact;      language fluent;     normal attention, concentration,     fund of knowledge Cranial Nerves:    The pupils are equal, round, and reactive to light. The fundi are normal and spontaneous venous pulsations are present. Visual fields are full to finger confrontation. Extraocular movements are intact. Trigeminal sensation is intact and the muscles of mastication are normal. The face is symmetric. The palate elevates in the midline. Hearing intact. Voice is normal. Shoulder shrug is normal. The tongue has normal motion without fasciculations.   Coordination:    Normal finger to nose and heel to shin. Normal rapid alternating movements.   Gait:    Heel-toe and tandem gait are normal.   Motor Observation:    No asymmetry, no atrophy, and no involuntary movements noted. Tone:    Normal muscle tone.    Posture:    Posture is normal. normal erect    Strength:    Strength is V/V in the upper and lower limbs.      Sensation: intact to LT, vibration, proprioception, temperature, pin prick.      Reflex Exam:  DTR's:    Deep tendon reflexes in the upper and lower extremities are normal bilaterally including 2+ AJs.  Toes:    The toes are downgoing bilaterally.   Clonus:    Clonus is absent.       Assessment/Plan:   Kerman PasseyMary K Verrette is a 65 y.o. female originally here as a referral from Dr. Katrinka BlazingSmith for history of abnormal MRI in the past maybe showing atrophy(cannot find the images or a report). PMHx hld, ADD, memory changes, fatigue, depression and anxiety. Patient is reporting what appears to be a long history  of perceived cognitive problems. She was diagnosed in 2004 with cognitive changes associated with post-concussive Syndrome with a  contribution from her depression and attention disorder. Repeat MRI of the brain was normal for age with normal cerebral volume.  MMSE 30/30. Repeat MRI of the brain was normal for age with normal cerebral volume. Patient appears to have a long  history of perceived cognitive disorder, likely multifactorial due to mood and attention deficit disorder,Normal cognitive aging, anxiety - very unlikely degenerative neurocognitive disease.   Paresthesias in the hands and toes: We'll perform a thorough neuropathy serum screen and EMG nerve conduction however sensation was normal to all modalities distally in the feet and hands.  Naomie DeanAntonia Mahkayla Preece, MD  [facility] [phone]  [address] [place], [place] [address]  Phone [phone] Fax [phone]  A total of 30 minutes was spent face-to-face with this patient. Over half this time was spent on counseling patient on the neuropathy diagnosis and different diagnostic and therapeutic options available.

## 2015-12-25 NOTE — Patient Instructions (Addendum)
Remember to drink plenty of fluid, eat healthy meals and do not skip any meals. Try to eat protein with a every meal and eat a healthy snack such as fruit or nuts in between meals. Try to keep a regular sleep-wake schedule and try to exercise daily, particularly in the form of walking, 20-30 minutes a day, if you can.    As far as diagnostic testing: Labs and emg/ncs  I would like to see you back for emg/ncs, sooner if we need to. Please call us with any interim questions, concerns, problems, updates or refill requests.   Our phone number is [phone]. We also have an after hours call service for urgent matters and there is a physician on-call for urgent questions. For any emergencies you know to call 911 or go to the nearest emergency room

## 2015-12-30 ENCOUNTER — Telehealth: Payer: Self-pay | Admitting: *Deleted

## 2015-12-30 ENCOUNTER — Other Ambulatory Visit (INDEPENDENT_AMBULATORY_CARE_PROVIDER_SITE_OTHER): Payer: Self-pay

## 2015-12-30 ENCOUNTER — Other Ambulatory Visit: Payer: Self-pay | Admitting: Neurology

## 2015-12-30 DIAGNOSIS — R768 Other specified abnormal immunological findings in serum: Secondary | ICD-10-CM

## 2015-12-30 DIAGNOSIS — Z0289 Encounter for other administrative examinations: Secondary | ICD-10-CM

## 2015-12-30 NOTE — Telephone Encounter (Signed)
Called and spoke to pt about lab results per Dr Lucia GaskinsAhern note. She verbalized understanding. She requested a copy of results. Advised she can see results via mychart. She states she does not do well with ,mychart. Advised pt she can obtain copy at our office. She will have to sign release form. She verbalized understanding.   She is coming today to have blood work done. I gave pt lab hours. Advised she does not need appt. Can stop in and let front desk know she is here for lab work only.

## 2015-12-30 NOTE — Telephone Encounter (Signed)
-----   Message from Anson FretAntonia B Ahern, MD sent at 12/30/2015  1:21 PM EDT ----- Everything normal except rheumatoid factor which may be a false positive because ANA was negative. I would like a follow up test at her convenience thanks

## 2015-12-31 LAB — RHEUMATOID FACTOR: RHEUMATOID FACTOR: 24.3 [IU]/mL — AB (ref 0.0–13.9)

## 2015-12-31 LAB — VITAMIN B6: VITAMIN B6: 9 ug/L (ref 2.0–32.8)

## 2015-12-31 LAB — MULTIPLE MYELOMA PANEL, SERUM
ALPHA 1: 0.2 g/dL (ref 0.0–0.4)
ALPHA2 GLOB SERPL ELPH-MCNC: 0.6 g/dL (ref 0.4–1.0)
Albumin SerPl Elph-Mcnc: 3.6 g/dL (ref 2.9–4.4)
Albumin/Glob SerPl: 1.4 (ref 0.7–1.7)
B-Globulin SerPl Elph-Mcnc: 0.9 g/dL (ref 0.7–1.3)
Gamma Glob SerPl Elph-Mcnc: 0.9 g/dL (ref 0.4–1.8)
Globulin, Total: 2.7 g/dL (ref 2.2–3.9)
IGA/IMMUNOGLOBULIN A, SERUM: 108 mg/dL (ref 87–352)
IGG (IMMUNOGLOBIN G), SERUM: 821 mg/dL (ref 700–1600)
IGM (IMMUNOGLOBULIN M), SRM: 106 mg/dL (ref 26–217)
TOTAL PROTEIN: 6.3 g/dL (ref 6.0–8.5)

## 2015-12-31 LAB — GLIADIN ANTIBODIES, SERUM
Antigliadin Abs, IgA: 3 units (ref 0–19)
Gliadin IgG: 2 units (ref 0–19)

## 2015-12-31 LAB — SJOGREN'S SYNDROME ANTIBODS(SSA + SSB)
ENA SSA (RO) Ab: <0.2 AI (ref 0.0–0.9)
ENA SSB (LA) Ab: <0.2 AI (ref 0.0–0.9)

## 2015-12-31 LAB — HEAVY METALS, BLOOD
ARSENIC: 16 ug/L (ref 2–23)
LEAD, BLOOD: NOT DETECTED ug/dL (ref 0–19)
Mercury: NOT DETECTED ug/L (ref 0.0–14.9)

## 2015-12-31 LAB — VITAMIN B1: Thiamine: 111.1 nmol/L (ref 66.5–200.0)

## 2015-12-31 LAB — ANA W/REFLEX: ANA: NEGATIVE

## 2015-12-31 LAB — B. BURGDORFI ANTIBODIES: Lyme IgG/IgM Ab: <0.91 {ISR} (ref 0.00–0.90)

## 2015-12-31 LAB — TISSUE TRANSGLUTAMINASE, IGA: Transglutaminase IgA: <2 U/mL (ref 0–3)

## 2015-12-31 LAB — HEPATITIS C ANTIBODY: Hep C Virus Ab: <0.1 s/co ratio (ref 0.0–0.9)

## 2015-12-31 LAB — HEMOGLOBIN A1C
ESTIMATED AVERAGE GLUCOSE: 108 mg/dL
Hgb A1c MFr Bld: 5.4 % (ref 4.8–5.6)

## 2016-01-01 ENCOUNTER — Telehealth: Payer: Self-pay | Admitting: *Deleted

## 2016-01-01 LAB — CYCLIC CITRUL PEPTIDE ANTIBODY, IGG/IGA: Cyclic Citrullin Peptide Ab: 7 units (ref 0–19)

## 2016-01-01 NOTE — Telephone Encounter (Signed)
-----   Message from Anson FretAntonia B Ahern, MD sent at 01/01/2016  9:40 AM EDT ----- ccp normal the elevated RF was a false positive thanks

## 2016-01-01 NOTE — Telephone Encounter (Signed)
Called and spoke to pt about lab results per Dr Ahern note. She verbalized understanding.  

## 2016-01-16 ENCOUNTER — Ambulatory Visit (INDEPENDENT_AMBULATORY_CARE_PROVIDER_SITE_OTHER): Payer: Medicare Other | Admitting: Neurology

## 2016-01-16 ENCOUNTER — Ambulatory Visit (INDEPENDENT_AMBULATORY_CARE_PROVIDER_SITE_OTHER): Payer: Self-pay | Admitting: Neurology

## 2016-01-16 DIAGNOSIS — R202 Paresthesia of skin: Secondary | ICD-10-CM

## 2016-01-16 DIAGNOSIS — E519 Thiamine deficiency, unspecified: Secondary | ICD-10-CM

## 2016-01-16 DIAGNOSIS — G629 Polyneuropathy, unspecified: Secondary | ICD-10-CM | POA: Diagnosis not present

## 2016-01-16 DIAGNOSIS — E531 Pyridoxine deficiency: Secondary | ICD-10-CM

## 2016-01-16 DIAGNOSIS — Z0289 Encounter for other administrative examinations: Secondary | ICD-10-CM

## 2016-01-16 NOTE — Progress Notes (Signed)
.    [facility]    Provider:  Dr Lucia GaskinsAhern Referring Provider: No ref. provider found Primary Care Physician:  Allean FoundSMITH,CANDACE THIELE, MD   HPI:  Jennifer Heath is a 65 y.o. female here for evaluation of paresthesias and numbness in the feet and hands.   Summary  Nerve conduction studies were performed on the right upper and right lower extremities:  Left APB Median motor nerve showed normal conductions with normal F Wave latency Left ADM Ulnar motor nerves showed normal conductions with normal F Wave latency Left EDB Peroneal motor nerve showed normal conductions with normal F Wave latency Left AH Tibial motor nerve showed normal conductions with normal F Wave latency Left second-digit Median sensory nerve conduction was within normal limits Left fifth-digit Ulnar sensory nerve conduction was within normal limits Left Sural sensory nerve conduction was within normal limits Left Superficial Peroneal sensory nerve conduction was within normal limits Left H Reflex showed normal latency  EMG Needle study was performed on selected left upper and left lower extremity muscles:   The Deltoid, Triceps, Pronator Teres, Opponens Pollicis, First Dorsal Interosseous, Vastus Medialis, Anterior Tibialis, Medial Gastrocnemius, Extensor Hallucis Longus and Abductor Hallucis muscles were within normal limits.  Conclusion: This is a normal study. No electrophysiologic evidence for large-fiber peripheral polyneuropathy, mononeuropathy, radiculopathy, or neuromuscular disorder. However a small fiber neuropathy could be responsible for patient's paresthesias and still evade detection by this study.   Jennifer DeanAntonia Sehaj Kolden, MD  [facility] [address] [place], [place] [id number]  Phone [phone] Fax [phone]

## 2016-01-17 NOTE — Progress Notes (Signed)
See procedure note.

## 2016-01-26 ENCOUNTER — Telehealth: Payer: Self-pay | Admitting: *Deleted

## 2016-01-26 HISTORY — PX: INNER EAR SURGERY: SHX679

## 2016-01-26 NOTE — Telephone Encounter (Signed)
Dr. Lucia GaskinsAhern would like Dr. Terrace ArabiaYan to perform a skin biopsy on this patient.  I have left messages for a return call on both home and cell numbers.  Patient can be scheduled in Dr. Zannie CoveYan's next available spot (30 minutes - any time before noon - office visit or new patient slot).

## 2016-01-26 NOTE — Telephone Encounter (Signed)
Spoke to patient - she has been scheduled for a skin biopsy with Dr. Terrace ArabiaYan on 02/16/16.

## 2016-01-27 NOTE — Telephone Encounter (Signed)
Pt confirmed 12/18 appt

## 2016-02-16 ENCOUNTER — Telehealth: Payer: Self-pay | Admitting: *Deleted

## 2016-02-16 ENCOUNTER — Encounter: Payer: Self-pay | Admitting: Neurology

## 2016-02-16 ENCOUNTER — Ambulatory Visit (INDEPENDENT_AMBULATORY_CARE_PROVIDER_SITE_OTHER): Payer: Medicare Other | Admitting: Neurology

## 2016-02-16 DIAGNOSIS — R202 Paresthesia of skin: Secondary | ICD-10-CM | POA: Diagnosis not present

## 2016-02-16 NOTE — Telephone Encounter (Signed)
Called and spoke to patient. She stated she used to have a sharp pain. Last week, she is starting to have burning pain in between her toes. Pain located on the side of her leg, along her knee area on the right side. Right leg has worse symptoms the past couple days. Usually it is her left leg. She had skin biopsy today with Dr Terrace ArabiaYan in our office.  She would like to start a medication as previously discussed with AA,MD. Advised I will send medication to AA,MD and call back once I receive response. She verbalized understanding.

## 2016-02-16 NOTE — Progress Notes (Signed)
Patient was in right lateral recombinant position. Sterile technique. 1% lidocaine with epinephrine was used for local anesthesia. Punctuated skin biopsy was performed. 3 mm skin sample were obtained at at left foot, above left extensor digitorum brevis; and left lateral calf, 10 cm above lateral malleolus; lateral thigh, 20 cm below superior iliac spine.  Patient tolerated the procedure well.  The wound was covered with neosporin antibiotic cream and bandage.

## 2016-02-16 NOTE — Telephone Encounter (Signed)
Burning between toes , now she [facility] to reach pt [phone]

## 2016-02-16 NOTE — Telephone Encounter (Signed)
Neurontin/gabapentin is always good for neuropathy if she has never tried that. Common side effects can include dizziness, drowsiness, weakness, tired feeling, nausea, diarrhea, constipation, blurred vision, headache, breast swelling, dry mouth, loss of balance or coordination. This is not an extensive list and she should stop if she experiences any side effects at all. Please ask if she is wiling to try it, thanks

## 2016-02-17 ENCOUNTER — Other Ambulatory Visit: Payer: Self-pay | Admitting: Neurology

## 2016-02-17 MED ORDER — GABAPENTIN 100 MG PO CAPS: 100.0000 mg | ORAL_CAPSULE | Freq: Three times a day (TID) | ORAL | 6 refills | Status: DC

## 2016-02-17 NOTE — Telephone Encounter (Signed)
Done, thanks

## 2016-02-17 NOTE — Telephone Encounter (Signed)
Called and relayed per AA,MD she called in gabapentin 100mg  capsule for her to take 1 capsule 3 times daily. [facility]. Gave [facility] phone number for her to call if she has further questions/concerns.

## 2016-02-17 NOTE — Telephone Encounter (Signed)
Called and spoke with patient. She is agreeable to try neurontin/gabapentin for her symptoms. Went over [employer] per AA,MD note. Advised I will call back and explain how she should take medication. She is going to mammogram appt this am and if she does not pick up, she advised it was ok to LVM with instructions.

## 2016-03-08 ENCOUNTER — Telehealth: Payer: Self-pay | Admitting: *Deleted

## 2016-03-08 NOTE — Telephone Encounter (Signed)
Called and spoke to pt. Received report form [facility] about skin biopsy results completed on 02/16/16 by Dr Terrace ArabiaYan. 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 Advised she should continue gabapentin to help manage her symptoms. Made f/u for 05/19/16 at 9am ,check in 830am per AA,MD request. Advised Dr Lucia GaskinsAhern can review results with her at this time. Pt advised that her symptoms have improved. She only have "smidgens of burning" and she only takes gabapentin as needed. She is also watching what she is eating and this has helped with her symptoms also.  She stated [place] and wondering how she can request refill after she gets back. Advised she can call our office or contact pharmacy who will contact us. She verbalized understanding.   Mailed copy of results to pt per AA,MD request. Pt aware.

## 2016-05-06 ENCOUNTER — Ambulatory Visit (INDEPENDENT_AMBULATORY_CARE_PROVIDER_SITE_OTHER): Payer: Medicare Other | Admitting: Physician Assistant

## 2016-05-06 ENCOUNTER — Ambulatory Visit (INDEPENDENT_AMBULATORY_CARE_PROVIDER_SITE_OTHER): Payer: Medicare Other

## 2016-05-06 VITALS — BP 130/60 | HR 81 | Temp 99.2°F | Resp 16 | Ht 64.5 in | Wt 146.8 lb

## 2016-05-06 DIAGNOSIS — M25571 Pain in right ankle and joints of right foot: Secondary | ICD-10-CM

## 2016-05-06 NOTE — Progress Notes (Signed)
[facility] [address], [place] [address] [phone]  Date:  05/06/2016   Name:  Jennifer Heath   DOB:  [birth year]   MRN:  [id number]  PCP:  Allean Found, MD    History of Present Illness:  Jennifer Heath is a 66 y.o. female patient who presents to [facility] for cc of right ankle pain.    Patient reports pain for the last 2 weeks.  She was on about a 2-3 foot area and jumped on to cement.  She had pain right ankle instantly.  No swelling or redness occurred.  The symptoms improved, but with some exercise at her gym, the pain resurfaced.    Patient Active Problem List   Diagnosis Date Noted  . Paresthesia 02/16/2016  . Depression 08/19/2015  . Generalized anxiety disorder 08/19/2015  . Memory changes 08/19/2015  . ADD (attention deficit disorder) 08/19/2015    Past Medical History:  Diagnosis Date  . ADD (attention deficit disorder)   . Depression   . Hyperlipidemia   . Osteoporosis     Past Surgical History:  Procedure Laterality Date  . INNER EAR SURGERY  01/26/2016  . TONSILLECTOMY      Social History  Substance Use Topics  . Smoking status: Former Smoker    Quit date: 03/02/1979  . Smokeless tobacco: Never Used  . Alcohol use 0.0 oz/week    Family History  Problem Relation Age of Onset  . Cancer Mother   . Cancer Father   . Stroke Father   . Heart disease Father   . Cancer Brother   . Cancer Maternal Grandmother   . Heart disease Maternal Grandfather   . Heart disease Paternal Grandmother   . Cancer Paternal Grandfather   . Dementia Neg Hx     Allergies  Allergen Reactions  . Azithromycin Hives  . Erythromycin Nausea Only    Medication list has been reviewed and updated.  Current Outpatient Prescriptions on File Prior to Visit  Medication Sig Dispense Refill  . gabapentin (NEURONTIN) 100 MG capsule Take 1 capsule (100 mg total) by mouth 3 (three) times daily. 90 capsule 6  . lisdexamfetamine (VYVANSE) 40 MG  capsule Take 40 mg by mouth every morning.    . pravastatin (PRAVACHOL) 20 MG tablet Take 20 mg by mouth daily.    Marland Kitchen ibuprofen (ADVIL,MOTRIN) 400 MG tablet Take 400 mg by mouth as needed.     No current facility-administered medications on file prior to visit.     ROS ROS otherwise unremarkable unless listed above  Physical Examination: BP 130/60   Pulse 81   Temp 99.2 F (37.3 C) (Oral)   Resp 16   Ht 5' 4.5" (1.638 m)   Wt 146 lb 12.8 oz (66.6 kg)   SpO2 99%   BMI 24.81 kg/m  Ideal Body Weight: Weight in (lb) to have BMI = 25: 147.6  Physical Exam  Constitutional: She is oriented to person, place, and time. She appears well-developed and well-nourished. No distress.  HENT:  Head: Normocephalic and atraumatic.  Right Ear: External ear normal.  Left Ear: External ear normal.  Eyes: Conjunctivae and EOM are normal. Pupils are equal, round, and reactive to light.  Cardiovascular: Normal rate.   Pulmonary/Chest: Effort normal. No respiratory distress.  Musculoskeletal:       Right ankle: She exhibits normal range of motion and no swelling. Tenderness. Medial malleolus (tender just inferior to the malleolus) tenderness found. Achilles tendon normal.  Normal rom throughout all planes of the ankle  Neurological: She is alert and oriented to person, place, and time.  Skin: She is not diaphoretic.  Psychiatric: She has a normal mood and affect. Her behavior is normal.   RIGHT ANKLE .FINDINGS: There is a questionable avulsion fracture from the navicular bone dorsally. No other abnormality is noted. Correlation to point tenderness is recommended. IMPRESSION: Questionable navicular bone fracture. Electronically Signed   By: Alcide CleverMark  Lukens M.D.   On: 01/12/2013 12:03  Dg Ankle Complete Right  Result Date: 05/06/2016 CLINICAL DATA:  Ankle pain just inferior to the medial malleolus. No known injury. EXAM: RIGHT ANKLE - COMPLETE 3+ VIEW COMPARISON:  Plain films right ankle  01/12/2013. FINDINGS: There is no evidence of fracture, dislocation, or joint effusion. There is no evidence of arthropathy or other focal bone abnormality. Tiny plantar calcaneal spur is unchanged. Soft tissues are unremarkable. IMPRESSION: Negative exam. Electronically Signed   By: Drusilla Kannerhomas  Dalessio M.D.   On: 05/06/2016 14:41   Assessment and Plan: Jennifer Heath is a 66 y.o. female who is here today for cc of right ankle pain. Likely a sprain Tylenol for pain. Advised icing three times for 15 minutes.   Advised to refrain from strenuous use of the ankle, such as "rebound jumping" for at least one week. Ankle brace given.  If his symptoms do not improve within 2 weeks, contact for referral to orthopedist. Acute right ankle pain - Plan: DG Ankle Complete Right  Trena PlattStephanie Dejon Lukas, PA-C [facility] 3/8/20183:07 PM

## 2016-05-06 NOTE — Patient Instructions (Addendum)
Likely a sprain.  Please ice the ankle three times per day for 15 minutes. I would like you to perform these stretches three times per day and follow directly with the 15 minutes of ice. Take tylenol for pain.   If the pain continues, let us know, and we can refer you back to your orthopedist. I would like you to refrain from strenuous exercise involving and pivoting the ankle for one week.  Focus on the rice Rest, Ice, Compression, Elevation Ankle Sprain, Phase I Rehab Ask your health care provider which exercises are safe for you. Do exercises exactly as told by your health care provider and adjust them as directed. It is normal to feel mild stretching, pulling, tightness, or discomfort as you do these exercises, but you should stop right away if you feel sudden pain or your pain gets worse.Do not begin these exercises until told by your health care provider. Stretching and range of motion exercises These exercises warm up your muscles and joints and improve the movement and flexibility of your lower leg and ankle. These exercises also help to relieve pain and stiffness. Exercise A: Gastroc and soleus stretch   1. Sit on the floor with your left / right leg extended. 2. Loop a belt or towel around the ball of your left / right foot. The ball of your foot is on the walking surface, right under your toes. 3. Keep your left / right ankle and foot relaxed and keep your knee straight while you use the belt or towel to pull your foot toward you. You should feel a gentle stretch behind your calf or knee. 4. Hold this position for __________ seconds, then release to the starting position. Repeat the exercise with your knee bent. You can put a pillow or a rolled bath towel under your knee to support it. You should feel a stretch deep in your calf or at your Achilles tendon. Repeat each stretch __________ times. Complete these stretches __________ times a day. Exercise B: Ankle alphabet   1. Sit with  your left / right leg supported at the lower leg.  Do not rest your foot on anything.  Make sure your foot has room to move freely. 2. Think of your left / right foot as a paintbrush, and move your foot to trace each letter of the alphabet in the air. Keep your hip and knee still while you trace. Make the letters as large as you can without feeling discomfort. 3. Trace every letter from A to Z. Repeat __________ times. Complete this exercise __________ times a day. Strengthening exercises These exercises build strength and endurance in your ankle and lower leg. Endurance is the ability to use your muscles for a long time, even after they get tired. Exercise C: Dorsiflexors   1. Secure a rubber exercise band or tube to an object, such as a table leg, that will stay still when the band is pulled. Secure the other end around your left / right foot. 2. Sit on the floor facing the object, with your left / right leg extended. The band or tube should be slightly tense when your foot is relaxed. 3. Slowly bring your foot toward you, pulling the band tighter. 4. Hold this position for __________ seconds. 5. Slowly return your foot to the starting position. Repeat __________ times. Complete this exercise __________ times a day. Exercise D: Plantar flexors   1. Sit on the floor with your left / right leg extended. 2. Loop  a rubber exercise tube or band around the ball of your left / right foot. The ball of your foot is on the walking surface, right under your toes.  Hold the ends of the band or tube in your hands.  The band or tube should be slightly tense when your foot is relaxed. 3. Slowly point your foot and toes downward, pushing them away from you. 4. Hold this position for __________ seconds. 5. Slowly return your foot to the starting position. Repeat __________ times. Complete this exercise __________ times a day. Exercise E: Evertors  1. Sit on the floor with your legs straight out in  front of you. 2. Loop a rubber exercise band or tube around the ball of your left / right foot. The ball of your foot is on the walking surface, right under your toes.  Hold the ends of the band in your hands, or secure the band to a stable object.  The band or tube should be slightly tense when your foot is relaxed. 3. Slowly push your foot outward, away from your other leg. 4. Hold this position for __________ seconds. 5. Slowly return your foot to the starting position. Repeat __________ times. Complete this exercise __________ times a day. This information is not intended to replace advice given to you by your health care provider. Make sure you discuss any questions you have with your health care provider. Document Released: 09/16/2004 Document Revised: 10/23/2015 Document Reviewed: 12/30/2014 Elsevier Interactive Patient Education  2017 [employer]     IF you received an x-ray today, you will receive an invoice from [facility]. Please contact [facility] at [phone] with questions or concerns regarding your invoice.   IF you received labwork today, you will receive an invoice from [facility]. Please contact [facility] at [phone] with questions or concerns regarding your invoice.   Our billing staff will not be able to assist you with questions regarding bills from these companies.  You will be contacted with the lab results as soon as they are available. The fastest way to get your results is to activate your My Chart account. Instructions are located on the last page of this paperwork. If you have not heard from us regarding the results in 2 weeks, please contact this office.

## 2016-05-19 ENCOUNTER — Telehealth: Payer: Self-pay

## 2016-05-19 ENCOUNTER — Encounter: Payer: Self-pay | Admitting: Neurology

## 2016-05-19 ENCOUNTER — Ambulatory Visit (INDEPENDENT_AMBULATORY_CARE_PROVIDER_SITE_OTHER): Payer: Medicare Other | Admitting: Neurology

## 2016-05-19 VITALS — BP 112/67 | HR 76 | Ht 64.5 in | Wt 146.0 lb

## 2016-05-19 DIAGNOSIS — G629 Polyneuropathy, unspecified: Secondary | ICD-10-CM | POA: Diagnosis not present

## 2016-05-19 NOTE — Patient Instructions (Signed)
Remember to drink plenty of fluid, eat healthy meals and do not skip any meals. Try to eat protein with a every meal and eat a healthy snack such as fruit or nuts in between meals. Try to keep a regular sleep-wake schedule and try to exercise daily, particularly in the form of walking, 20-30 minutes a day, if you can.   As far as your medications are concerned, I would like to suggest: Continue neurontin for numbness and tingling  As far as diagnostic testing: Labs today  I would like to see you back in 1 year, sooner if we need to. Please call us with any interim questions, concerns, problems, updates or refill requests.    Our phone number is [phone]. We also have an after hours call service for urgent matters and there is a physician on-call for urgent questions. For any emergencies you know to call 911 or go to the nearest emergency room

## 2016-05-19 NOTE — Telephone Encounter (Signed)
[facility] was called T # [phone]. Received faxed pathology report showing diagnosis consistent with small fiber neuropathy. Sent to med records for scanning and copy given to Dr. Lucia GaskinsAhern for review.

## 2016-05-19 NOTE — Progress Notes (Signed)
[facility]    Provider:  Dr Lucia GaskinsAhern Referring Provider: Merri BrunetteSmith, Candace, MD Primary Care Physician:  Allean FoundSMITH,CANDACE THIELE, MD  CC: Paresthesias in fingers and toes  Interval history 05/19/2016: Skin biopsy showed small-fiber neuropathy. She is feeling well. She takes neurontin as needed. Her feet have been "pretty good". [place]. Her fingers bothered her more than her toes. Discussed food associations such as gluten. Also discuss other labs we can test for etiology of small-fiber neuropathy.  Interval history 12/25/2015:   MRI of the brain 10/2015 was normal. Appropriate cerebral volume, no indication of "brain shrinkage" as patient has been told in the past.  Today she returns with paresthesias. She has pains in the feet and hands, she does not have diabetes and her thyroid and b12 have been tested. She noticed it 2 years ago in the setting of stress worsening when she ate badly, worsening since September, numbness in the toes like someone is a pin in her big toe, affected by what she ate, worse at night, will perform an emg/ncs of one arm and one leg. The fingers are more sensitive, all the fingers a slight tingling. Father had diabetes.   VWU:JWJXHPI:Idabelle K Novelliis a 66 y.o.femalehere as a referral from Dr. Marney SettingSmithfor history of abnormal MRI in the past showing "brain shrinkage" per patient. PMHx hld, ADD, memory changes, fatigue. She reports she hit her head in the past, she was having symptoms of cognitive changes and she was told she had a shrunken brain after MRI and evaluation in our office. She cried when she heard that and recently a friend died of dementia and she remembered the MRI and wanted to follow up. She was asked in the past about alcohol use. She does not have a significant history of alcohol use. This worries her. She found some documents some office notes when she saw our practice in 2004 and brought them with her(reviewed below). A close friend  passed away recently, from dementia. She is worried about dementia. She is having memory problems which have been ongoing for years. She also has depression. She has been driving back and forth to [place], that has been stressful, staying with daughter and son in law is hard. "Everything is hard", such as decision making process. She is moving slower. She jogs every morning and she is slower at home. She is not able to express herself. She has to go back and see the news a few times, she can;t absorb information as well. She lives alone, she is worried about dementia. In the last year, she is having more difficulty with finances, she is in debt on her credit, she is missing bills. She has been dizzy as well. She had neurocognitive testing in the past in 2004.   Reviewed notes, labs and imaging from outside physicians, which showed:  Reviewed labs from pcp, CMP normal with creatinine 0.77 08/07/2015, cbc normal. b12 443, tsh 2.25, ldl 106, hgba1c 5.4.  Reviewed pcp notes. She has a history of ADD on vyvanse. In 2003/2004 she fell down and had head injury and MRi showed "brain shrinkage", father with dementia, she has dizziness and fatigue, harder time with trouble thinking, processing speed, trouble putting things into words. Reviewed EPIC, could not find any previous brain imaging or notes from neurology, looked in old system centricity also no notes. Reviewed notes in EPIC, none significant for chief complaint, was seen in the ED 12/2012 for left knee injury, in 11/2013 for the flu, in  06/2015 for an insect bite, no mention of memory changes or concerns from physicians seeing her.   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 Patient was seen in September 2004 for intermittent dizziness as well as cognitive impairment since  a closed head injury on 06/28/2002. Patient had fallen down at home rolled over 6 steps and landed at the base in the back of her head. She did not lose consciousness or have any skull fracture or bleeding. Since then she's been describing dizziness. Intermittent vertigo as well particularly when turning her head when bending down to pick up objects. CAT scan was Unremarkable.Meclizine made her feel sleepy and drug. They also tried maneuvers in the office which only helped her a little bit. She also reported cognitive difficulties since the accident increased difficulty with analyzing information,poor concentration as well as multitasking, short-term memory also quite poor. Even prior to the accident she was concerned that she had mild attention deficit problems, as well as depression for which she was treated on Wellbutrin. Concerta helped her. 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 She continued to have slow thinking, poor memory as well as depression. Vestibular therapy was recommended.3 months later she was seen again with intermittent dizziness, not compliant with home vestibular stabilization exercises, continued to have cognitive difficulty predominantly related to short-term memory problems.She was referred for neuropsych testing.  Review of Systems: Patient complains of symptoms per HPI as well as the following symptoms: fatigue, hearing loss, ringing in ears, memory loss, headache, dizziness, depression, not enough sleep, decreased energy. Pertinent negatives per HPI. All others negative.  Social History   Social History  . Marital status: Single    Spouse name: N/A  .  Number of children: 2  . Years of education: 16   Occupational History  . 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 Smoking status: Former Smoker    Quit date: 03/02/1979  . Smokeless tobacco: Never Used  . Alcohol use 0.0 oz/week  . Drug use: No  . Sexual activity: No   Other Topics Concern  . Not on file   Social History Narrative   Lives alone   Caffeine use: very little   Right-handed    Family History  Problem Relation Age of Onset  . Cancer Mother   . Cancer Father   . Stroke Father   . Heart disease Father   . Cancer Brother   . Cancer Maternal Grandmother   . Heart disease Maternal Grandfather   . Heart disease Paternal Grandmother   . Cancer Paternal Grandfather   . Dementia Neg Hx     Past Medical History:  Diagnosis Date  . ADD (attention deficit disorder)   . Depression   . Hyperlipidemia   . Neuropathy (HCC)   . Osteoporosis     Past Surgical History:  Procedure Laterality Date  . INNER EAR SURGERY  01/26/2016  . TONSILLECTOMY      Current Outpatient Prescriptions  Medication Sig Dispense Refill  . cholecalciferol (VITAMIN D) 1000 units tablet Take 1,000 Units by mouth daily.    Marland Kitchen  gabapentin (NEURONTIN) 100 MG capsule Take 1 capsule (100 mg total) by mouth 3 (three) times daily. 90 capsule 6  . ibuprofen (ADVIL,MOTRIN) 400 MG tablet Take 400 mg by mouth as needed.    Marland Kitchen lisdexamfetamine (VYVANSE) 40 MG capsule Take 40 mg by mouth every morning.    . pravastatin (PRAVACHOL) 20 MG tablet Take 20 mg by mouth daily.     No current facility-administered medications for this visit.     Allergies as of 05/19/2016 - Review Complete 05/19/2016  Allergen Reaction Noted  . Azithromycin Hives 01/12/2013  . Erythromycin Nausea Only 02/07/2012    Vitals: BP 112/67   Pulse 76   Ht 5' 4.5" (1.638 m)   Wt 146 lb (66.2 kg)   BMI 24.67 kg/m  Last Weight:  Wt Readings from Last 1 Encounters:  05/19/16 146 lb (66.2  kg)   Last Height:   Ht Readings from Last 1 Encounters:  05/19/16 5' 4.5" (1.638 m)       Neuro: Detailed Neurologic Exam  Speech: Speech is normal; fluent and spontaneous with normal comprehension.  Cognition: The patient is oriented to person, place, and time;  recent and remote memory intact;  language fluent;  normal attention, concentration,  fund of knowledge Cranial Nerves: The pupils are equal, round, and reactive to light. The fundi are normal and spontaneous venous pulsations are present. Visual fields are full to finger confrontation. Extraocular movements are intact. Trigeminal sensation is intact and the muscles of mastication are normal. The face is symmetric. The palate elevates in the midline. Hearing intact. Voice is normal. Shoulder shrug is normal. The tongue has normal motion without fasciculations.   Coordination: Normal finger to nose and heel to shin. Normal rapid alternating movements.   Gait: Heel-toe and tandem gait are normal.   Motor Observation: No asymmetry, no atrophy, and no involuntary movements noted. Tone: Normal muscle tone.   Posture: Posture is normal. normal erect  Strength: Strength is V/V in the upper and lower limbs.   Sensation: intact to LT, vibration, proprioception, temperature, pin prick.   Reflex Exam:  DTR's: Deep tendon reflexes in the upper and lower extremities are normal bilaterally including 2+ AJs.  Toes: The toes are downgoing bilaterally.  Clonus: Clonus is absent.    Assessment/Plan:MARCHIA DIGUGLIELMO is a 66 y.o. female originally here as a referral from Dr. Katrinka Blazing for history of abnormal MRI in the past maybe showing atrophy(cannot find the images or a report). PMHx hld, ADD, memory changes, fatigue, depression and anxiety. Patient is reporting what appears to be a long history of perceived cognitive problems. She was diagnosed in 2004 with  cognitive changes associated with post-concussive Syndrome with a contribution from her depression and attention disorder. Repeat MRI of the brain was normal for age with normal cerebral volume.  Skin biopsy showed small-fiber neuropathy, idiopathic. Extensive testing did not reveal etiology. Will follow clinically and treat symptomatically.   MMSE 30/30. Repeat MRI of the brain was normal for age with normal cerebral volume. Patient appears to have a long history of perceived cognitive disorder, likely multifactorial due to mood and attention deficit disorder,Normal cognitive aging, anxiety - very unlikely degenerative neurocognitive disease.   Naomie Dean, MD  [facility] [address] [place], [place] [address]  Phone [phone] Fax [phone]  A total of 25 minutes was spent face-to-face with this patient. Over half this time was spent on counseling patient on the small-fiber neuropathy diagnosis and different diagnostic and  therapeutic options available.

## 2016-05-21 LAB — PAN-ANCA
ANCA Proteinase 3: <3.5 U/mL (ref 0.0–3.5)
Atypical pANCA: <1:20 {titer}
C-ANCA: <1:20 {titer}
Myeloperoxidase Ab: <9 U/mL (ref 0.0–9.0)
P-ANCA: <1:20 {titer}

## 2016-05-21 LAB — B12 AND FOLATE PANEL
Folate: 7.4 ng/mL (ref >3.0)
Vitamin B-12: 479 pg/mL (ref 232–1245)

## 2016-05-21 LAB — METHYLMALONIC ACID, SERUM: Methylmalonic Acid: 120 nmol/L (ref 0–378)

## 2016-05-21 LAB — ANGIOTENSIN CONVERTING ENZYME: ANGIO CONVERT ENZYME: 44 U/L (ref 14–82)

## 2016-05-24 ENCOUNTER — Telehealth: Payer: Self-pay | Admitting: *Deleted

## 2016-05-24 NOTE — Telephone Encounter (Signed)
-----   Message from Anson FretAntonia B Ahern, MD sent at 05/21/2016  1:20 PM EDT ----- Labs all normal

## 2016-05-24 NOTE — Telephone Encounter (Signed)
Called and LVM for pt about normal labs per AA,MD note. Gave [facility] phone number if she has further questions or concerns.

## 2016-06-07 NOTE — Telephone Encounter (Addendum)
Pt called request results sent to her. She also wants to know if the neuropathy could effect her eyesight. Said she did not think to mention at her OV that she had problems with her vision. She can be reached at [phone] or [phone]

## 2016-06-07 NOTE — Telephone Encounter (Signed)
I'm not really sure she would have to come in for an appointment and tell me about her vision changes but this would not be common thanks

## 2016-06-07 NOTE — Telephone Encounter (Signed)
Results not yet scanned into pt's chart.

## 2016-06-08 ENCOUNTER — Telehealth: Payer: Self-pay | Admitting: *Deleted

## 2016-06-08 NOTE — Telephone Encounter (Signed)
Returned call to pt. Says that she "lost the pictures that she had of her skin biopsy that showed the neuropathy." Asked if these could be sent to her. Let her know that report was sent to be scanned but is not yet in her chart. Will check w/ medical records.  Pt also notes increased difficulty w/ her vision. Plans on contacting optometrist to see if new glasses rx is needed. If this does not improve eyesight or if vision continues to worsen, she will call back for sooner neuro appt or referral to opthalmology. Verbalized understanding and appreciation for call.

## 2016-06-08 NOTE — Telephone Encounter (Signed)
Pt pathology report mailed on 06/08/17.

## 2017-02-02 ENCOUNTER — Telehealth: Payer: Self-pay | Admitting: Neurology

## 2017-02-02 MED ORDER — GABAPENTIN 100 MG PO CAPS: 100.0000 mg | ORAL_CAPSULE | Freq: Three times a day (TID) | ORAL | 11 refills | Status: AC

## 2017-02-02 NOTE — Addendum Note (Signed)
Addended by: Bertram SavinULBERTSON, Eilan Mcinerny L on: 02/02/2017 09:00 AM   Modules accepted: Orders

## 2017-02-02 NOTE — Telephone Encounter (Signed)
[facility]

## 2017-02-02 NOTE — Telephone Encounter (Signed)
Patient requesting refill of gabapentin (NEURONTIN) 100 MG capsule [facility].

## 2017-04-01 HISTORY — PX: COLONOSCOPY: SHX174

## 2017-04-04 HISTORY — PX: TOOTH EXTRACTION: SUR596

## 2017-05-19 ENCOUNTER — Ambulatory Visit: Payer: Medicare Other | Admitting: Neurology

## 2017-05-19 ENCOUNTER — Encounter: Payer: Self-pay | Admitting: Neurology

## 2017-05-19 VITALS — BP 127/62 | HR 72 | Ht 65.0 in | Wt 155.4 lb

## 2017-05-19 DIAGNOSIS — R768 Other specified abnormal immunological findings in serum: Secondary | ICD-10-CM

## 2017-05-19 DIAGNOSIS — G629 Polyneuropathy, unspecified: Secondary | ICD-10-CM

## 2017-05-19 NOTE — Progress Notes (Signed)
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 She is stable, can refer back to primary care. The pain is associated with food, discussed this again with her, there have been gluten associations. She says salt affects her. She is here today with a notebook and pages of questions, answered all of them.  She perseverates on oral glucose tolerance test, we can order.. RF elevated with negative ccp, will recheck. Cannot tell her if CBD oil is safe.   Interval history 05/19/2016: Skin biopsy showed small-fiber neuropathy. She is feeling well. She takes neurontin as needed. Her feet have been "pretty good". She went to [place]. Her fingers bothered her more than her toes. Discussed food associations such as gluten. Also discuss other labs we can test for etiology of small-fiber neuropathy.  Interval history 12/25/2015:   MRI of the brain 10/2015 was normal. Appropriate cerebral volume, no indication of "brain shrinkage" as patient has been told in the past.  Today she returns with paresthesias. She has pains in the feet and hands, she does not have diabetes and her thyroid and b12 have been tested. She noticed it 2 years ago in the [place] of stress worsening when she ate badly, worsening since September, numbness in the toes like someone is a pin in her big toe, affected by what she ate, worse at night, will perform an emg/ncs of one arm and one leg. The fingers are more sensitive, all the fingers a slight tingling. Father had diabetes.   Jennifer K Novelliis a 67 y.o.femalehere as a referral from Dr. Marney Heath history of abnormal MRI in the past showing "brain shrinkage" per patient. PMHx hld, ADD, memory changes, fatigue. She reports she hit her head in the past, she was having symptoms of cognitive  changes and she was told she had a shrunken brain after MRI and evaluation in our office. She cried when she heard that and recently a friend died of dementia and she remembered the MRI and wanted to follow up. She was asked in the past about alcohol use. She does not have a significant history of alcohol use. This worries her. She found some documents some office notes when she saw our practice in 2004 and brought them with her(reviewed below). A close friend passed away recently, from dementia. She is worried about dementia. She is having memory problems which have been ongoing for years. She also has depression. She has been driving back and forth to [place], that has been stressful, staying with daughter and son in law is hard. "Everything is hard", such as decision making process. She is moving slower. She jogs every morning and she is slower at home. She is not able to express herself. She has to go back and see the news a few times, she can;t absorb information as well. She lives alone, she is worried about dementia. In the last year, she is having more difficulty with finances, she is in debt on her credit, she is missing bills. She has been dizzy as well. She had neurocognitive testing in the past in 2004.   Reviewed notes, labs and imaging from outside physicians, which showed:  Reviewed labs from pcp, CMP normal with creatinine 0.77 08/07/2015, cbc normal. b12 443, tsh 2.25, ldl 106, hgba1c 5.4.  Reviewed pcp notes. She has a history of ADD  on vyvanse. In 2003/2004 she fell down and had head injury and MRi showed "brain shrinkage", father with dementia, she has dizziness and fatigue, harder time with trouble thinking, processing speed, trouble putting things into words. Reviewed EPIC, could not find any previous brain imaging or notes from neurology, looked in old system centricity also no notes. Reviewed notes in EPIC, none significant for chief complaint, was seen in the ED 12/2012 for left  knee injury, in 11/2013 for the flu, in 06/2015 for an insect bite, no mention of memory changes or concerns from physicians seeing her.   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 Patient was seen in September 2004 for intermittent dizziness as well as cognitive impairment since a closed head injury on 06/28/2002. Patient had fallen down at home rolled over 6 steps and landed at the base in the back of her head. She did not lose consciousness or have any skull fracture or bleeding. Since then she's been describing dizziness. Intermittent vertigo as well particularly when turning her head when bending down to pick up objects. CAT scan was Unremarkable.Meclizine made her feel sleepy and drug. They also tried maneuvers in the office which only helped her a little bit. She also reported cognitive difficulties since the accident increased difficulty with analyzing information,poor concentration as well as multitasking, short-term memory also quite poor. Even prior to the accident she was concerned that she had mild attention deficit problems, as well as depression for which she was treated on Wellbutrin. Concerta helped her. 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 She continued to have slow thinking, poor memory as well as depression. Vestibular therapy was recommended.3 months later she was seen again with intermittent dizziness, not compliant with home vestibular stabilization exercises, continued to have  cognitive difficulty predominantly related to short-term memory problems.She was referred for neuropsych testing.  Review of Systems: Patient complains of symptoms per HPI as well as the following symptoms: fatigue, hearing loss, ringing in ears, memory loss, headache, dizziness, depression, not enough sleep, decreased energy. Pertinent negatives per HPI. All others negative.  Social History   Socioeconomic History  . Marital status: Single    Spouse name: Not on file  . Number of children: 2  . Years of education: 45  . Highest education level: Not on file  Occupational History  . Occupation: Retired: Agricultural consultant  . 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 Financial resource strain: Not on file  . Food insecurity:    Worry: Not on file    Inability: Not on file  . Transportation needs:    Medical: Not on file    Non-medical: Not on file  Tobacco Use  . Smoking status: Former Smoker    Last attempt to quit: 03/02/1979    Years since quitting: 38.2  . Smokeless tobacco: Never Used  Substance and Sexual Activity  . Alcohol use: Not Currently    Alcohol/week: 0.0 oz    Comment: a few sips at Thanksgiving & Christmas  . Drug use: No  . Sexual activity: Never    Birth control/protection: None  Lifestyle  . Physical  activity:    Days per week: Not on file    Minutes per session: Not on file  . Stress: Not on file  Relationships  . Social connections:    Talks on phone: Not on file    Gets together: Not on file    Attends religious service: Not on file    Active member of club or organization: Not on file    Attends meetings of clubs or organizations: Not on file    Relationship status: Not on file  . Intimate partner violence:    Fear of current or ex partner: Not on file    Emotionally abused: Not on file    Physically abused: Not on file    Forced sexual activity: Not on file  Other Topics Concern  . Not on file  Social History Narrative   Lives alone    Caffeine use: very little   Right-handed    Family History  Problem Relation Age of Onset  . Cancer Mother   . Cancer Father   . Stroke Father   . Heart disease Father   . Cancer Brother   . Cancer Maternal Grandmother   . Heart disease Maternal Grandfather   . Heart disease Paternal Grandmother   . Cancer Paternal Grandfather   . Dementia Neg Hx     Past Medical History:  Diagnosis Date  . ADD (attention deficit disorder)   . Depression   . Hyperlipidemia   . Neuropathy   . Osteoporosis     Past Surgical History:  Procedure Laterality Date  . COLONOSCOPY  04/01/2017  . INNER EAR SURGERY  01/26/2016  . TONSILLECTOMY    . TOOTH EXTRACTION  04/04/2017    Current Outpatient Medications  Medication Sig Dispense Refill  . cholecalciferol (VITAMIN D) 1000 units tablet Take 1,000 Units by mouth daily.    Marland Kitchen gabapentin (NEURONTIN) 100 MG capsule Take 1 capsule (100 mg total) by mouth 3 (three) times daily. (Patient taking differently: Take 100 mg by mouth 3 (three) times daily as needed. ) 90 capsule 11  . ibuprofen (ADVIL,MOTRIN) 400 MG tablet Take 400 mg by mouth as needed.    Marland Kitchen lisdexamfetamine (VYVANSE) 40 MG capsule Take 40 mg by mouth every morning.     No current facility-administered medications for this visit.     Allergies as of 05/19/2017 - Review Complete 05/19/2017  Allergen Reaction Noted  . Amoxicillin Hives 05/19/2017  . Azithromycin Hives 01/12/2013  . Clindamycin Hives 05/19/2017  . Doxycycline Hives 05/19/2017  . Erythromycin Nausea Only 02/07/2012    Vitals: BP 127/62 (BP Location: Right Arm, Patient Position: Sitting)   Pulse 72   Ht 5\' 5"  (1.651 m)   Wt 155 lb 6.4 oz (70.5 kg)   BMI 25.86 kg/m  Last Weight:  Wt Readings from Last 1 Encounters:  05/19/17 155 lb 6.4 oz (70.5 kg)   Last Height:   Ht Readings from Last 1 Encounters:  05/19/17 5\' 5"  (1.651 m)       Neuro: Detailed Neurologic Exam  Speech: Speech is normal; fluent  and spontaneous with normal comprehension.  Cognition: The patient is oriented to person, place, and time;  recent and remote memory intact;  language fluent;  normal attention, concentration,  fund of knowledge Cranial Nerves: The pupils are equal, round, and reactive to light. The fundi are normal and spontaneous venous pulsations are present. Visual fields are full to finger confrontation. Extraocular movements are intact. Trigeminal sensation is intact and  the muscles of mastication are normal. The face is symmetric. The palate elevates in the midline. Hearing intact. Voice is normal. Shoulder shrug is normal. The tongue has normal motion without fasciculations.   Coordination: Normal finger to nose and heel to shin. Normal rapid alternating movements.   Gait: Heel-toe and tandem gait are normal.   Motor Observation: No asymmetry, no atrophy, and no involuntary movements noted. Tone: Normal muscle tone.   Posture: Posture is normal. normal erect  Strength: Strength is V/V in the upper and lower limbs.   Sensation: intact to LT, vibration, proprioception, temperature, pin prick.   Reflex Exam:  DTR's: Deep tendon reflexes in the upper and lower extremities are normal bilaterally including 2+ AJs.  Toes: The toes are downgoing bilaterally.  Clonus: Clonus is absent.    Assessment/Plan:Jennifer Heath is a 67 y.o. female originally here as a referral from Dr. Katrinka BlazingSmith for history of abnormal MRI in the past maybe showing atrophy(cannot find the images or a report). PMHx hld, ADD, memory changes, fatigue, depression and anxiety. Patient is reporting what appears to be a long history of perceived cognitive problems. She was diagnosed in 2004 with cognitive changes associated with post-concussive Syndrome with a contribution from her depression and attention disorder. Repeat MRI of the brain was normal for age with  normal cerebral volume.  Skin biopsy showed small-fiber neuropathy, idiopathic. Extensive testing did not reveal etiology. Will follow clinically and treat symptomatically.   MMSE 30/30. Repeat MRI of the brain was normal for age with normal cerebral volume. Patient appears to have a long history of perceived cognitive disorder, likely multifactorial due to mood and attention deficit disorder,Normal cognitive aging, anxiety - very unlikely degenerative neurocognitive disease.  Repeat the RF and CCP, glucose tolerance test  Orders Placed This Encounter  Procedures  . Rheumatoid factor  . CYCLIC CITRUL PEPTIDE ANTIBODY, IGG/IGA  . Glucose tolerance, 2 hours     Naomie DeanAntonia Leilanee Righetti, MD  [facility] [address] [place], [place] [id number]  Phone [phone] Fax [phone]  A total of 30 minutes was spent face-to-face with this patient. Over half this time was spent on counseling patient on the small-fiber neuropathy diagnosis and different diagnostic and therapeutic options available.

## 2017-05-19 NOTE — Patient Instructions (Signed)
Peripheral Neuropathy Peripheral neuropathy is a type of nerve damage. It affects nerves that carry signals between the spinal cord and other parts of the body. These are called peripheral nerves. With peripheral neuropathy, one nerve or a group of nerves may be damaged. What are the causes? Many things can damage peripheral nerves. For some people with peripheral neuropathy, the cause is unknown. Some causes include:  Diabetes. This is the most common cause of peripheral neuropathy.  Injury to a nerve.  Pressure or stress on a nerve that lasts a long time.  Too little vitamin B. Alcoholism can lead to this.  Infections.  Autoimmune diseases, such as multiple sclerosis and systemic lupus erythematosus.  Inherited nerve diseases.  Some medicines, such as cancer drugs.  Toxic substances, such as lead and mercury.  Too little blood flowing to the legs.  Kidney disease.  Thyroid disease.  What are the signs or symptoms? Different people have different symptoms. The symptoms you have will depend on which of your nerves is damaged. Common symptoms include:  Loss of feeling (numbness) in the feet and hands.  Tingling in the feet and hands.  Pain that burns.  Very sensitive skin.  Weakness.  Not being able to move a part of the body (paralysis).  Muscle twitching.  Clumsiness or poor coordination.  Loss of balance.  Not being able to control your bladder.  Feeling dizzy.  Sexual problems.  How is this diagnosed? Peripheral neuropathy is a symptom, not a disease. Finding the cause of peripheral neuropathy can be hard. To figure that out, your health care provider will take a medical history and do a physical exam. A neurological exam will also be done. This involves checking things affected by your brain, spinal cord, and nerves (nervous system). For example, your health care provider will check your reflexes, how you move, and what you can feel. Other types of tests  may also be ordered, such as:  Blood tests.  A test of the fluid in your spinal cord.  Imaging tests, such as CT scans or an MRI.  Electromyography (EMG). This test checks the nerves that control muscles.  Nerve conduction velocity tests. These tests check how fast messages pass through your nerves.  Nerve biopsy. A small piece of nerve is removed. It is then checked under a microscope.  How is this treated?  Medicine is often used to treat peripheral neuropathy. Medicines may include: ? Pain-relieving medicines. Prescription or over-the-counter medicine may be suggested. ? Antiseizure medicine. This may be used for pain. ? Antidepressants. These also may help ease pain from neuropathy. ? Lidocaine. This is a numbing medicine. You might wear a patch or be given a shot. ? Mexiletine. This medicine is typically used to help control irregular heart rhythms.  Surgery. Surgery may be needed to relieve pressure on a nerve or to destroy a nerve that is causing pain.  Physical therapy to help movement.  Assistive devices to help movement. Follow these instructions at home:  Only take over-the-counter or prescription medicines as directed by your health care provider. Follow the instructions carefully for any given medicines. Do not take any other medicines without first getting approval from your health care provider.  If you have diabetes, work closely with your health care provider to keep your blood sugar under control.  If you have numbness in your feet: ? Check every day for signs of injury or infection. Watch for redness, warmth, and swelling. ? Wear padded socks and comfortable   shoes. These help protect your feet.  Do not do things that put pressure on your damaged nerve.  Do not smoke. Smoking keeps blood from getting to damaged nerves.  Avoid or limit alcohol. Too much alcohol can cause a lack of B vitamins. These vitamins are needed for healthy nerves.  Develop a good  support system. Coping with peripheral neuropathy can be stressful. Talk to a mental health specialist or join a support group if you are struggling.  Follow up with your health care provider as directed. Contact a health care provider if:  You have new signs or symptoms of peripheral neuropathy.  You are struggling emotionally from dealing with peripheral neuropathy.  You have a fever. Get help right away if:  You have an injury or infection that is not healing.  You feel very dizzy or begin vomiting.  You have chest pain.  You have trouble breathing. This information is not intended to replace advice given to you by your health care provider. Make sure you discuss any questions you have with your health care provider. Document Released: 02/05/2002 Document Revised: 07/24/2015 Document Reviewed: 10/23/2012 Elsevier Interactive Patient Education  2017 Elsevier Inc.  

## 2017-05-20 ENCOUNTER — Telehealth: Payer: Self-pay | Admitting: Neurology

## 2017-05-20 NOTE — Telephone Encounter (Signed)
-----   Message from Anson FretAntonia B Ahern, MD sent at 05/20/2017  9:58 AM EDT ----- Rheumatoid factor normal thanks

## 2017-05-20 NOTE — Telephone Encounter (Signed)
I called the patient and made her aware of the normal lab finding. Pt verbalized understanding. Pt had no questions at this time but was encouraged to call back if questions arise.

## 2017-05-21 LAB — RHEUMATOID FACTOR: RHEUMATOID FACTOR: 13 [IU]/mL (ref 0.0–13.9)

## 2017-05-21 LAB — CYCLIC CITRUL PEPTIDE ANTIBODY, IGG/IGA: Cyclic Citrullin Peptide Ab: 9 units (ref 0–19)

## 2018-04-14 IMAGING — CR DG CHEST 2V
2 series · 2 of 2 positions shown · non-contrast
Comparison: No prior.

CLINICAL DATA: Preoperative chest x-ray.

EXAM:
CHEST  2 VIEW

[w chest pa]
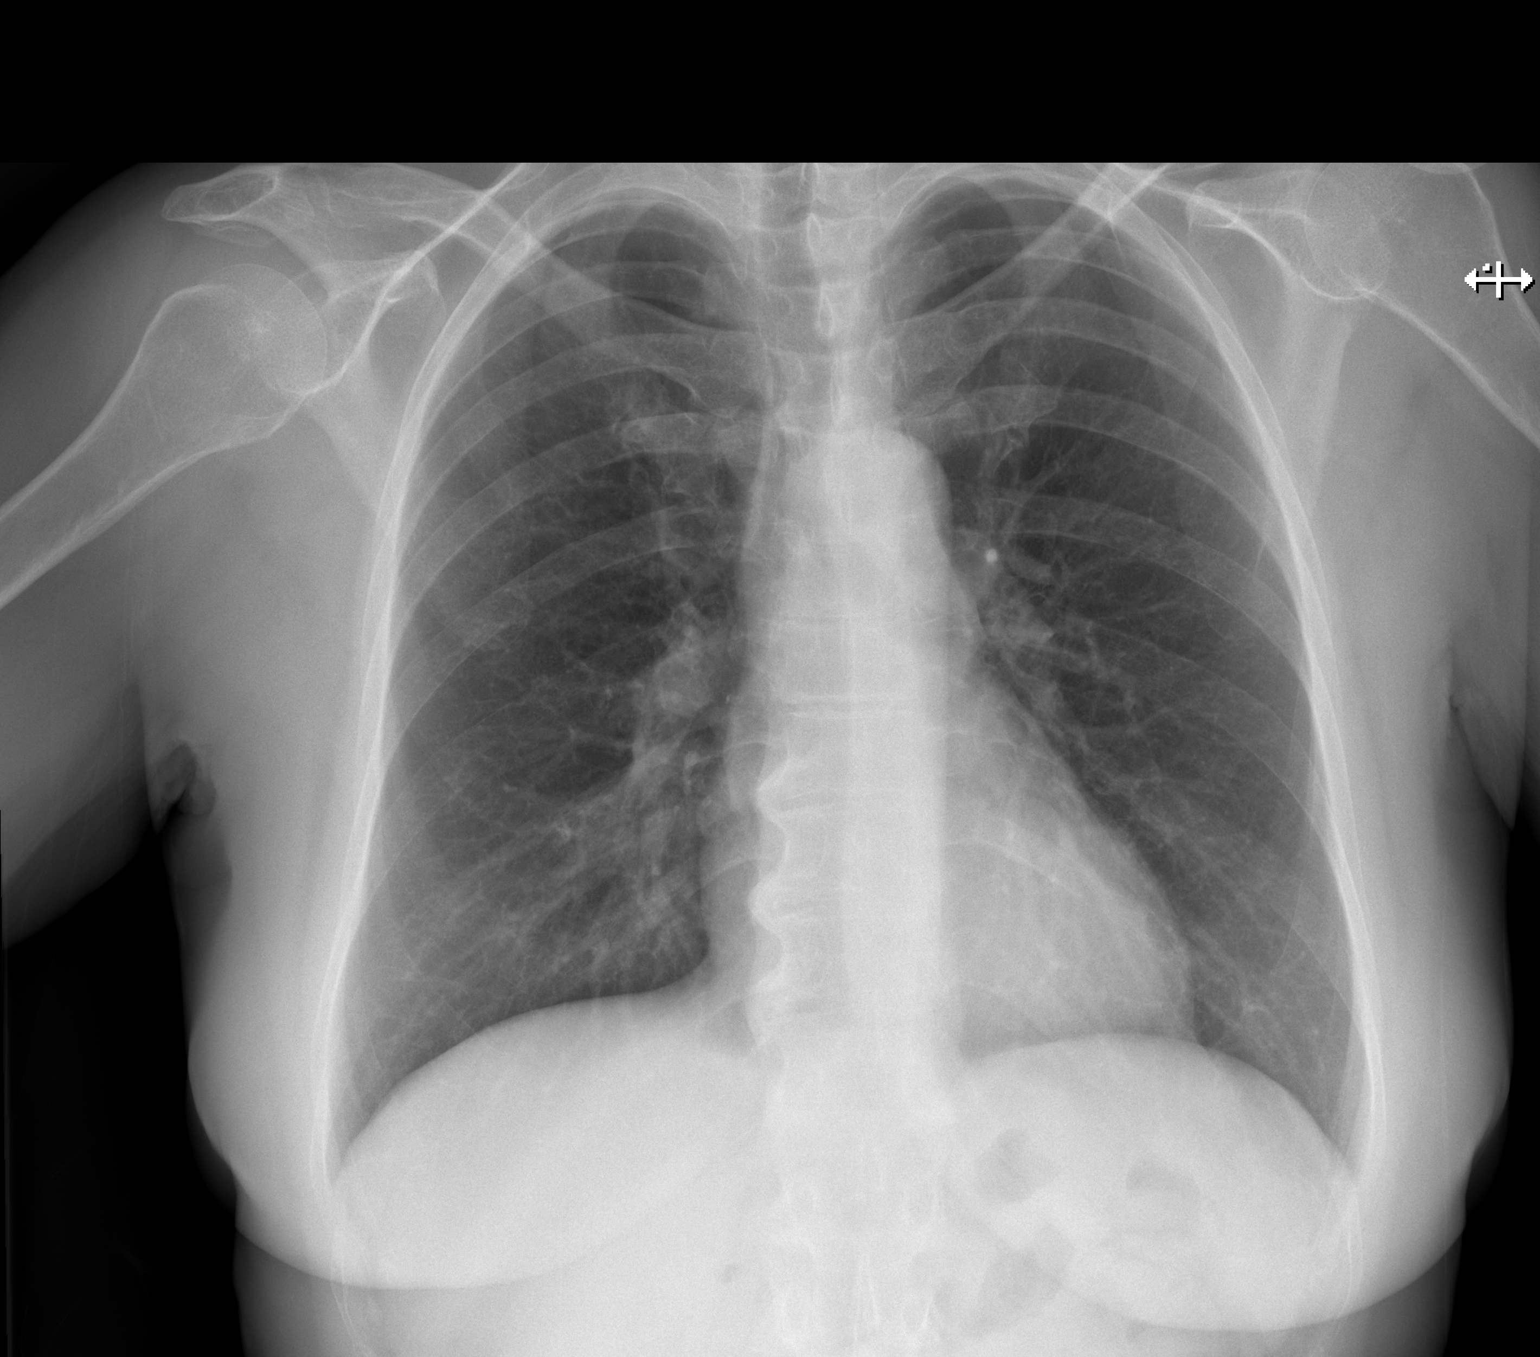

[w chest lat]
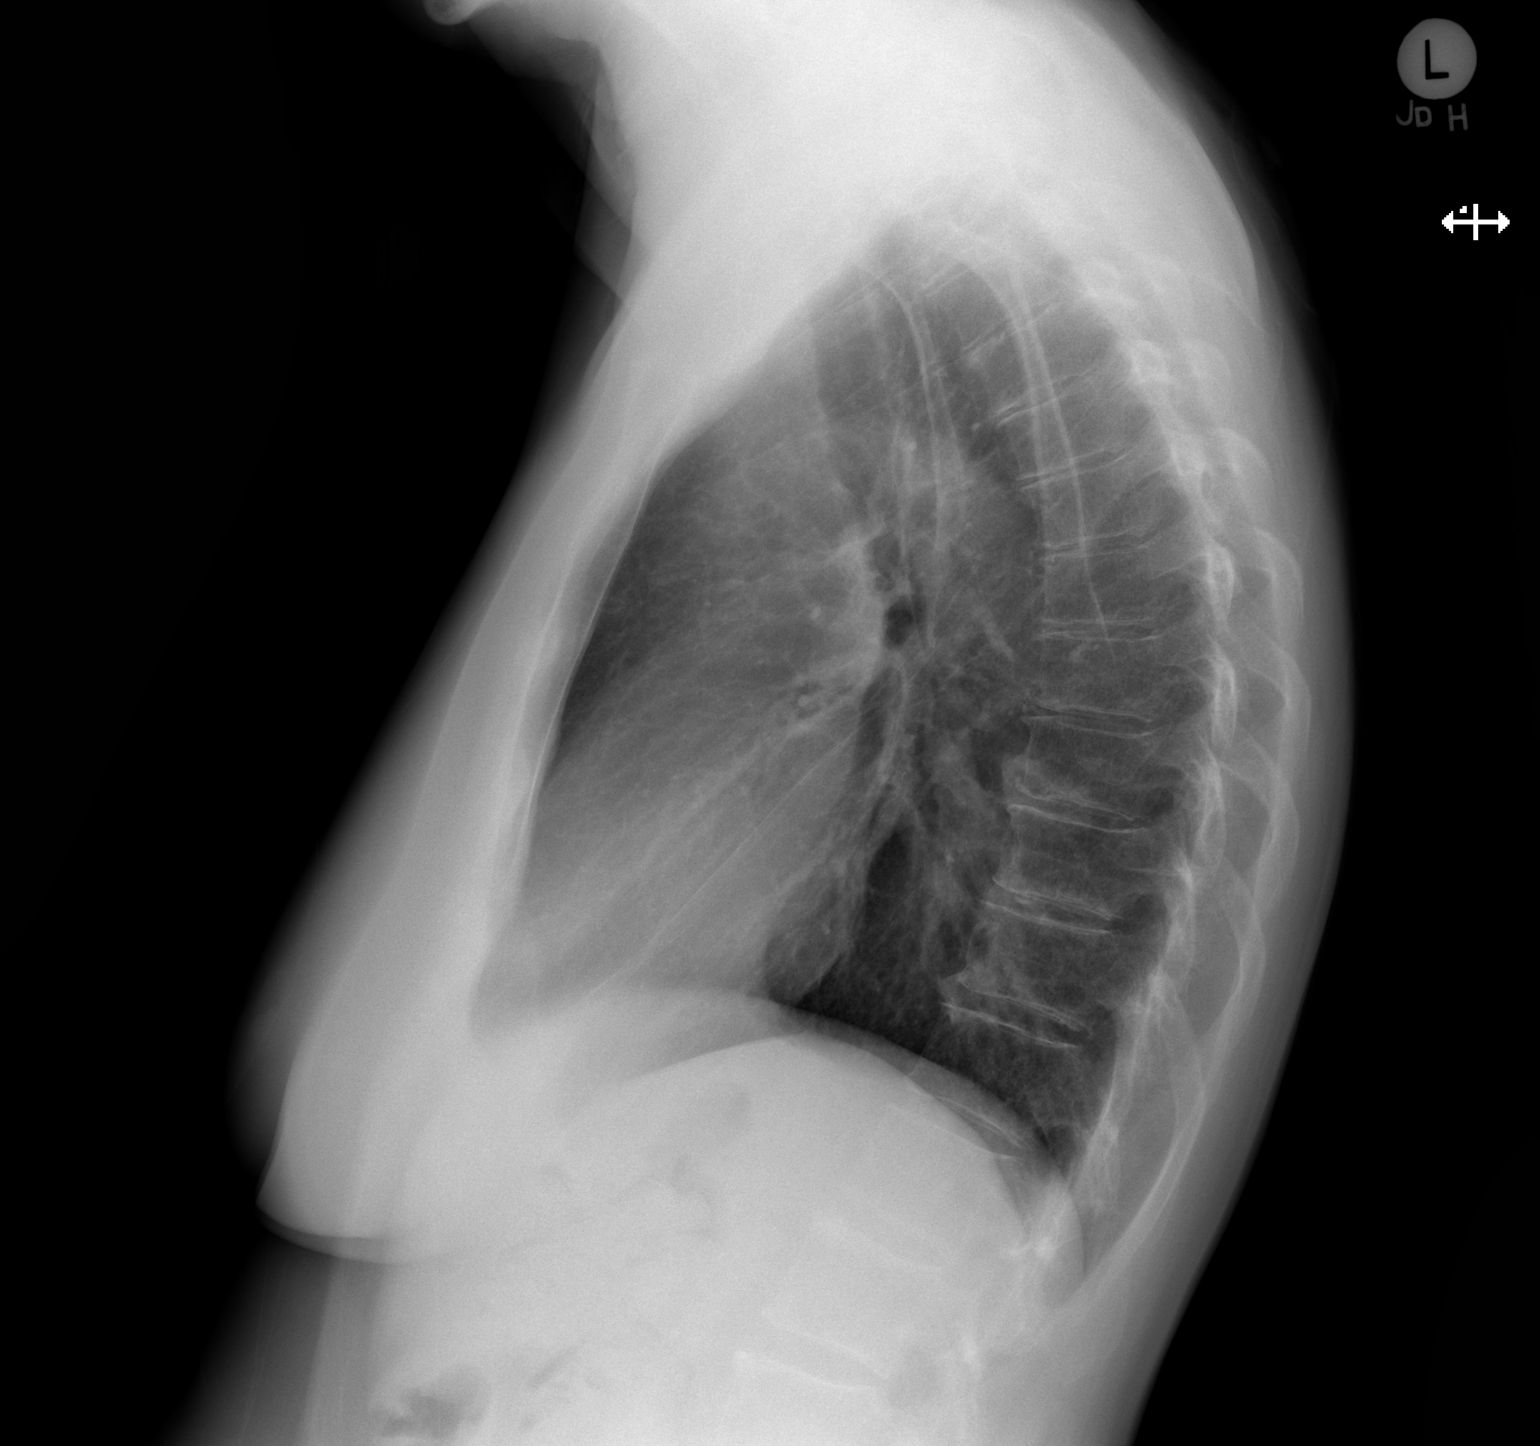

[2 of 2 positions shown; findings below may reference images not displayed]

FINDINGS: The heart size and mediastinal contours are within normal limits.
Both lungs are clear. Degenerative changes thoracic spine.
IMPRESSION: No acute cardiopulmonary disease.

## 2018-08-26 IMAGING — DX DG ANKLE COMPLETE 3+V*R*
3 series · 3 of 3 positions shown · non-contrast
Comparison: Plain films right ankle 01/12/2013.

CLINICAL DATA: Ankle pain just inferior to the medial malleolus. No
known injury.

EXAM:
RIGHT ANKLE - COMPLETE 3+ VIEW

[ankle ap]
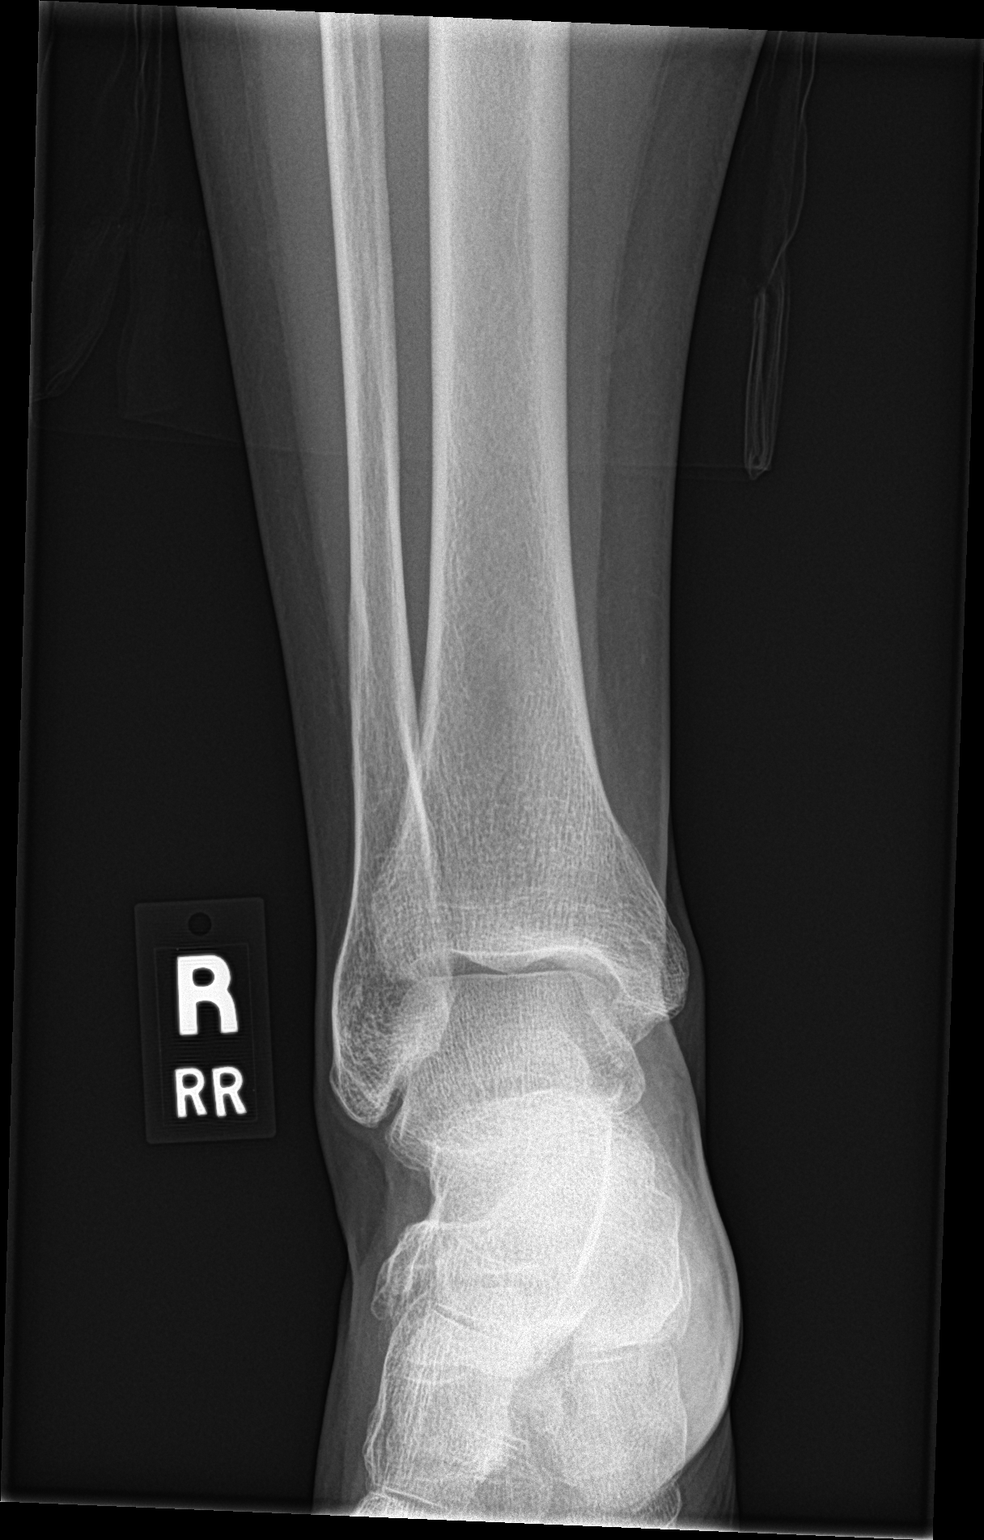

[ankle obl]
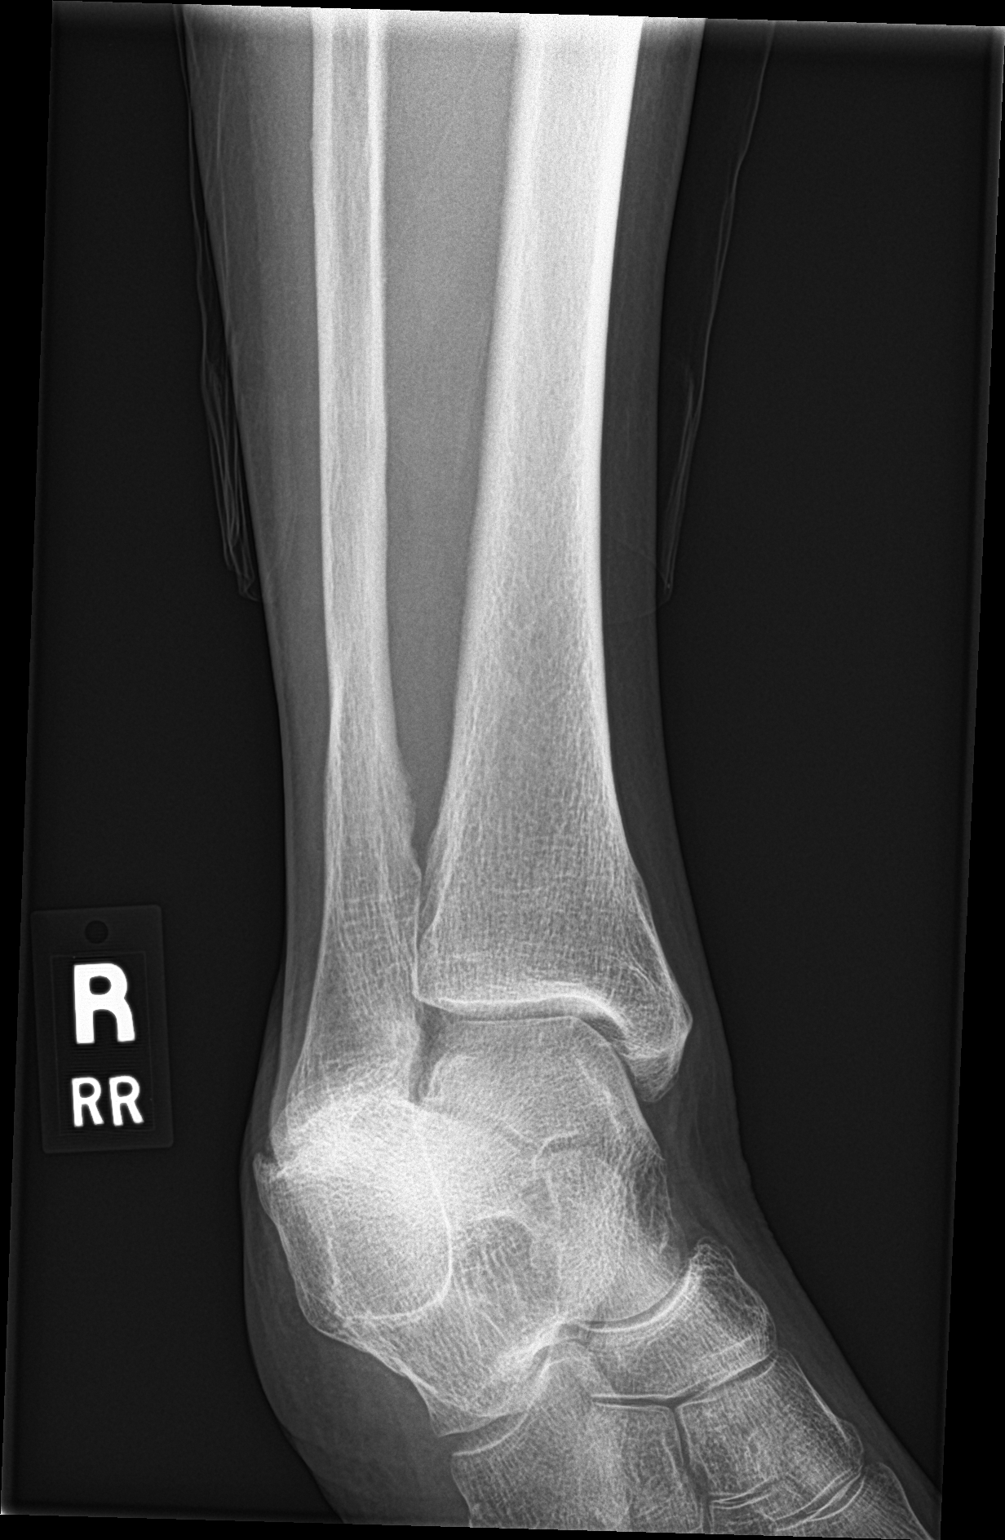

[ankle lat]
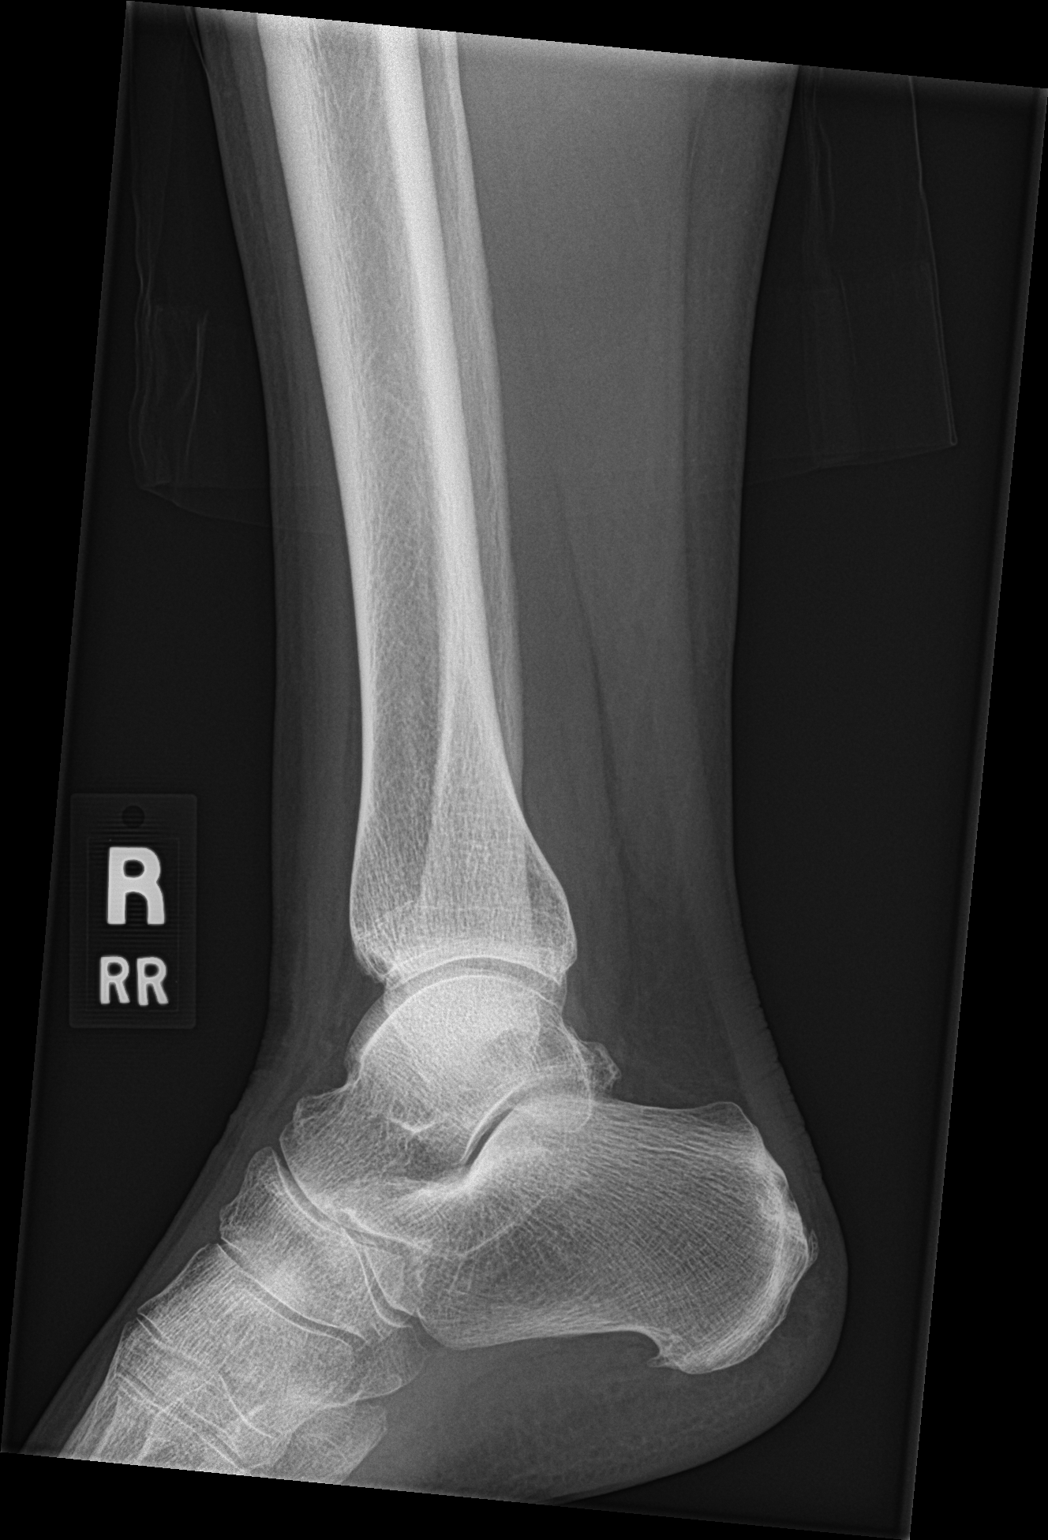

[3 of 3 positions shown; findings below may reference images not displayed]

FINDINGS: There is no evidence of fracture, dislocation, or joint effusion.
There is no evidence of arthropathy or other focal bone abnormality.
Tiny plantar calcaneal spur is unchanged. Soft tissues are
unremarkable.
IMPRESSION: Negative exam.

## 2019-03-21 ENCOUNTER — Ambulatory Visit: Payer: Medicare Other

## 2019-03-22 ENCOUNTER — Ambulatory Visit: Payer: Medicare PPO | Attending: Internal Medicine

## 2019-03-22 DIAGNOSIS — Z23 Encounter for immunization: Secondary | ICD-10-CM | POA: Insufficient documentation

## 2019-03-22 NOTE — Progress Notes (Signed)
   Covid-19 Vaccination Clinic  Name:  Jennifer Heath    MRN: [id number] DOB: 02-Sep-1950  03/22/2019  [name] was observed post Covid-19 immunization for 15 minutes without incidence. She was provided with Vaccine Information Sheet and instruction to access the V-Safe system.   [name] was instructed to call 911 with any severe reactions post vaccine: Marland Kitchen Difficulty breathing  . Swelling of your face and throat  . A fast heartbeat  . A bad rash all over your body  . Dizziness and weakness    Immunizations Administered    Name Date Dose VIS Date Route   Pfizer COVID-19 Vaccine 03/22/2019  9:17 AM 0.3 mL 02/09/2019 Intramuscular   Manufacturer: [employer], [employer]   Lot: [id number]   NDC: [address]-8

## 2019-04-09 ENCOUNTER — Ambulatory Visit: Payer: Medicare PPO

## 2019-04-12 ENCOUNTER — Ambulatory Visit: Payer: Medicare PPO | Attending: Internal Medicine

## 2019-04-12 DIAGNOSIS — Z23 Encounter for immunization: Secondary | ICD-10-CM

## 2019-04-12 NOTE — Progress Notes (Signed)
   Covid-19 Vaccination Clinic  Name:  TAYLORANN TKACH    MRN: [id number] DOB: 05/23/1950  04/12/2019  Ms. Mccullum was observed post Covid-19 immunization for 15 minutes without incidence. She was provided with Vaccine Information Sheet and instruction to access the V-Safe system.   Ms. Sutphen was instructed to call 911 with any severe reactions post vaccine: Marland Kitchen Difficulty breathing  . Swelling of your face and throat  . A fast heartbeat  . A bad rash all over your body  . Dizziness and weakness    Immunizations Administered    Name Date Dose VIS Date Route   Pfizer COVID-19 Vaccine 04/12/2019  8:17 AM 0.3 mL 02/09/2019 Intramuscular   Manufacturer: [employer], [employer]   Lot: [id number]   NDC: [address]-2

## 2019-04-18 ENCOUNTER — Ambulatory Visit: Payer: Medicare Other

## 2019-05-07 DIAGNOSIS — F341 Dysthymic disorder: Secondary | ICD-10-CM | POA: Diagnosis not present

## 2019-06-07 DIAGNOSIS — F341 Dysthymic disorder: Secondary | ICD-10-CM | POA: Diagnosis not present

## 2019-07-03 DIAGNOSIS — F341 Dysthymic disorder: Secondary | ICD-10-CM | POA: Diagnosis not present

## 2019-07-31 DIAGNOSIS — F341 Dysthymic disorder: Secondary | ICD-10-CM | POA: Diagnosis not present

## 2019-09-06 DIAGNOSIS — F341 Dysthymic disorder: Secondary | ICD-10-CM | POA: Diagnosis not present

## 2019-09-17 DIAGNOSIS — M810 Age-related osteoporosis without current pathological fracture: Secondary | ICD-10-CM | POA: Diagnosis not present

## 2019-09-17 DIAGNOSIS — F900 Attention-deficit hyperactivity disorder, predominantly inattentive type: Secondary | ICD-10-CM | POA: Diagnosis not present

## 2019-09-17 DIAGNOSIS — E785 Hyperlipidemia, unspecified: Secondary | ICD-10-CM | POA: Diagnosis not present

## 2019-10-08 DIAGNOSIS — S0500XA Injury of conjunctiva and corneal abrasion without foreign body, unspecified eye, initial encounter: Secondary | ICD-10-CM | POA: Diagnosis not present

## 2019-10-08 DIAGNOSIS — F341 Dysthymic disorder: Secondary | ICD-10-CM | POA: Diagnosis not present

## 2019-10-10 DIAGNOSIS — S0500XA Injury of conjunctiva and corneal abrasion without foreign body, unspecified eye, initial encounter: Secondary | ICD-10-CM | POA: Diagnosis not present

## 2019-10-15 DIAGNOSIS — S0500XD Injury of conjunctiva and corneal abrasion without foreign body, unspecified eye, subsequent encounter: Secondary | ICD-10-CM | POA: Diagnosis not present

## 2019-10-16 DIAGNOSIS — L720 Epidermal cyst: Secondary | ICD-10-CM | POA: Diagnosis not present

## 2019-10-16 DIAGNOSIS — L570 Actinic keratosis: Secondary | ICD-10-CM | POA: Diagnosis not present

## 2019-10-16 DIAGNOSIS — L821 Other seborrheic keratosis: Secondary | ICD-10-CM | POA: Diagnosis not present

## 2019-10-16 DIAGNOSIS — L719 Rosacea, unspecified: Secondary | ICD-10-CM | POA: Diagnosis not present

## 2019-10-16 DIAGNOSIS — Z808 Family history of malignant neoplasm of other organs or systems: Secondary | ICD-10-CM | POA: Diagnosis not present

## 2019-10-16 DIAGNOSIS — D225 Melanocytic nevi of trunk: Secondary | ICD-10-CM | POA: Diagnosis not present

## 2019-10-16 DIAGNOSIS — Z86018 Personal history of other benign neoplasm: Secondary | ICD-10-CM | POA: Diagnosis not present

## 2019-11-08 DIAGNOSIS — F341 Dysthymic disorder: Secondary | ICD-10-CM | POA: Diagnosis not present

## 2019-12-03 DIAGNOSIS — M25551 Pain in right hip: Secondary | ICD-10-CM | POA: Diagnosis not present

## 2019-12-03 DIAGNOSIS — M25561 Pain in right knee: Secondary | ICD-10-CM | POA: Diagnosis not present

## 2019-12-05 DIAGNOSIS — M25551 Pain in right hip: Secondary | ICD-10-CM | POA: Diagnosis not present

## 2019-12-05 DIAGNOSIS — M25561 Pain in right knee: Secondary | ICD-10-CM | POA: Diagnosis not present

## 2019-12-06 DIAGNOSIS — F341 Dysthymic disorder: Secondary | ICD-10-CM | POA: Diagnosis not present

## 2020-01-03 DIAGNOSIS — F341 Dysthymic disorder: Secondary | ICD-10-CM | POA: Diagnosis not present

## 2020-01-07 DIAGNOSIS — Z03818 Encounter for observation for suspected exposure to other biological agents ruled out: Secondary | ICD-10-CM | POA: Diagnosis not present

## 2020-01-07 DIAGNOSIS — R0981 Nasal congestion: Secondary | ICD-10-CM | POA: Diagnosis not present

## 2020-01-07 DIAGNOSIS — R519 Headache, unspecified: Secondary | ICD-10-CM | POA: Diagnosis not present

## 2020-01-08 DIAGNOSIS — M25562 Pain in left knee: Secondary | ICD-10-CM | POA: Diagnosis not present

## 2020-01-08 DIAGNOSIS — M25561 Pain in right knee: Secondary | ICD-10-CM | POA: Diagnosis not present

## 2020-01-14 DIAGNOSIS — F341 Dysthymic disorder: Secondary | ICD-10-CM | POA: Diagnosis not present

## 2020-02-26 DIAGNOSIS — B349 Viral infection, unspecified: Secondary | ICD-10-CM | POA: Diagnosis not present

## 2020-02-26 DIAGNOSIS — Z03818 Encounter for observation for suspected exposure to other biological agents ruled out: Secondary | ICD-10-CM | POA: Diagnosis not present

## 2020-02-26 DIAGNOSIS — R059 Cough, unspecified: Secondary | ICD-10-CM | POA: Diagnosis not present

## 2020-03-03 DIAGNOSIS — U071 COVID-19: Secondary | ICD-10-CM | POA: Diagnosis not present

## 2020-03-03 DIAGNOSIS — Z20822 Contact with and (suspected) exposure to covid-19: Secondary | ICD-10-CM | POA: Diagnosis not present

## 2020-03-03 DIAGNOSIS — J22 Unspecified acute lower respiratory infection: Secondary | ICD-10-CM | POA: Diagnosis not present

## 2020-03-28 DIAGNOSIS — Z1231 Encounter for screening mammogram for malignant neoplasm of breast: Secondary | ICD-10-CM | POA: Diagnosis not present

## 2020-03-31 DIAGNOSIS — E559 Vitamin D deficiency, unspecified: Secondary | ICD-10-CM | POA: Diagnosis not present

## 2020-03-31 DIAGNOSIS — G629 Polyneuropathy, unspecified: Secondary | ICD-10-CM | POA: Diagnosis not present

## 2020-03-31 DIAGNOSIS — M810 Age-related osteoporosis without current pathological fracture: Secondary | ICD-10-CM | POA: Diagnosis not present

## 2020-03-31 DIAGNOSIS — Z1159 Encounter for screening for other viral diseases: Secondary | ICD-10-CM | POA: Diagnosis not present

## 2020-03-31 DIAGNOSIS — Z1389 Encounter for screening for other disorder: Secondary | ICD-10-CM | POA: Diagnosis not present

## 2020-03-31 DIAGNOSIS — F900 Attention-deficit hyperactivity disorder, predominantly inattentive type: Secondary | ICD-10-CM | POA: Diagnosis not present

## 2020-03-31 DIAGNOSIS — Z0000 Encounter for general adult medical examination without abnormal findings: Secondary | ICD-10-CM | POA: Diagnosis not present

## 2020-03-31 DIAGNOSIS — E785 Hyperlipidemia, unspecified: Secondary | ICD-10-CM | POA: Diagnosis not present

## 2020-06-25 ENCOUNTER — Other Ambulatory Visit: Payer: Self-pay

## 2020-06-25 ENCOUNTER — Ambulatory Visit: Payer: Medicare PPO | Attending: Internal Medicine

## 2020-06-25 ENCOUNTER — Ambulatory Visit: Payer: Medicare PPO

## 2020-07-14 DIAGNOSIS — H43813 Vitreous degeneration, bilateral: Secondary | ICD-10-CM | POA: Diagnosis not present

## 2020-07-14 DIAGNOSIS — K120 Recurrent oral aphthae: Secondary | ICD-10-CM | POA: Diagnosis not present

## 2020-07-14 DIAGNOSIS — B009 Herpesviral infection, unspecified: Secondary | ICD-10-CM | POA: Diagnosis not present

## 2020-07-15 ENCOUNTER — Ambulatory Visit: Payer: Medicare PPO

## 2020-07-17 DIAGNOSIS — M25531 Pain in right wrist: Secondary | ICD-10-CM | POA: Diagnosis not present

## 2020-07-21 ENCOUNTER — Ambulatory Visit: Payer: Medicare PPO | Attending: Internal Medicine

## 2020-07-21 ENCOUNTER — Other Ambulatory Visit: Payer: Self-pay

## 2020-07-21 ENCOUNTER — Other Ambulatory Visit (HOSPITAL_BASED_OUTPATIENT_CLINIC_OR_DEPARTMENT_OTHER): Payer: Self-pay

## 2020-07-21 DIAGNOSIS — Z23 Encounter for immunization: Secondary | ICD-10-CM

## 2020-07-21 MED ORDER — COVID-19 MRNA VACC (MODERNA) 100 MCG/0.5ML IM SUSP
INTRAMUSCULAR | 0 refills | Status: AC
  Filled 2020-07-21: qty 0.25, 1d supply, fill #0

## 2020-07-21 NOTE — Progress Notes (Signed)
   Covid-19 Vaccination Clinic  Name:  Jennifer Heath    MRN: [id number] DOB: Jul 29, 1950  07/21/2020  Jennifer Heath was observed post Covid-19 immunization for 15 minutes without incident. She was provided with Vaccine Information Sheet and instruction to access the V-Safe system.   Jennifer Heath was instructed to call 911 with any severe reactions post vaccine: Marland Kitchen Difficulty breathing  . Swelling of face and throat  . A fast heartbeat  . A bad rash all over body  . Dizziness and weakness   Immunizations Administered    Name Date Dose VIS Date Route   Moderna Covid-19 Booster Vaccine 07/21/2020 10:04 AM 0.25 mL 12/19/2019 Intramuscular   Manufacturer: Moderna   Lot: [id number]   NDC: 35701-779-39

## 2020-08-15 DIAGNOSIS — J069 Acute upper respiratory infection, unspecified: Secondary | ICD-10-CM | POA: Diagnosis not present

## 2020-08-15 DIAGNOSIS — R509 Fever, unspecified: Secondary | ICD-10-CM | POA: Diagnosis not present

## 2020-08-15 DIAGNOSIS — Z03818 Encounter for observation for suspected exposure to other biological agents ruled out: Secondary | ICD-10-CM | POA: Diagnosis not present

## 2020-08-15 DIAGNOSIS — R059 Cough, unspecified: Secondary | ICD-10-CM | POA: Diagnosis not present

## 2020-08-19 DIAGNOSIS — J209 Acute bronchitis, unspecified: Secondary | ICD-10-CM | POA: Diagnosis not present

## 2020-09-25 DIAGNOSIS — F900 Attention-deficit hyperactivity disorder, predominantly inattentive type: Secondary | ICD-10-CM | POA: Diagnosis not present

## 2020-09-25 DIAGNOSIS — R0609 Other forms of dyspnea: Secondary | ICD-10-CM | POA: Diagnosis not present

## 2020-09-25 DIAGNOSIS — E785 Hyperlipidemia, unspecified: Secondary | ICD-10-CM | POA: Diagnosis not present

## 2020-09-25 DIAGNOSIS — M810 Age-related osteoporosis without current pathological fracture: Secondary | ICD-10-CM | POA: Diagnosis not present

## 2020-10-17 DIAGNOSIS — D225 Melanocytic nevi of trunk: Secondary | ICD-10-CM | POA: Diagnosis not present

## 2020-10-17 DIAGNOSIS — L719 Rosacea, unspecified: Secondary | ICD-10-CM | POA: Diagnosis not present

## 2020-10-17 DIAGNOSIS — L570 Actinic keratosis: Secondary | ICD-10-CM | POA: Diagnosis not present

## 2020-10-17 DIAGNOSIS — L821 Other seborrheic keratosis: Secondary | ICD-10-CM | POA: Diagnosis not present

## 2020-10-17 DIAGNOSIS — D485 Neoplasm of uncertain behavior of skin: Secondary | ICD-10-CM | POA: Diagnosis not present

## 2020-10-17 DIAGNOSIS — Z86018 Personal history of other benign neoplasm: Secondary | ICD-10-CM | POA: Diagnosis not present

## 2020-10-17 DIAGNOSIS — Z808 Family history of malignant neoplasm of other organs or systems: Secondary | ICD-10-CM | POA: Diagnosis not present

## 2020-11-05 DIAGNOSIS — L820 Inflamed seborrheic keratosis: Secondary | ICD-10-CM | POA: Diagnosis not present

## 2020-11-05 DIAGNOSIS — D485 Neoplasm of uncertain behavior of skin: Secondary | ICD-10-CM | POA: Diagnosis not present

## 2020-11-05 DIAGNOSIS — L720 Epidermal cyst: Secondary | ICD-10-CM | POA: Diagnosis not present

## 2020-12-14 DIAGNOSIS — R5383 Other fatigue: Secondary | ICD-10-CM | POA: Diagnosis not present

## 2020-12-14 DIAGNOSIS — J069 Acute upper respiratory infection, unspecified: Secondary | ICD-10-CM | POA: Diagnosis not present

## 2020-12-14 DIAGNOSIS — Z03818 Encounter for observation for suspected exposure to other biological agents ruled out: Secondary | ICD-10-CM | POA: Diagnosis not present

## 2020-12-14 DIAGNOSIS — R058 Other specified cough: Secondary | ICD-10-CM | POA: Diagnosis not present

## 2020-12-18 DIAGNOSIS — H1033 Unspecified acute conjunctivitis, bilateral: Secondary | ICD-10-CM | POA: Diagnosis not present

## 2021-01-09 ENCOUNTER — Other Ambulatory Visit: Payer: Self-pay

## 2021-01-09 ENCOUNTER — Ambulatory Visit: Payer: Medicare PPO | Attending: Internal Medicine

## 2021-01-09 ENCOUNTER — Other Ambulatory Visit (HOSPITAL_BASED_OUTPATIENT_CLINIC_OR_DEPARTMENT_OTHER): Payer: Self-pay

## 2021-01-09 DIAGNOSIS — Z23 Encounter for immunization: Secondary | ICD-10-CM

## 2021-01-09 MED ORDER — MODERNA COVID-19 BIVAL BOOSTER 50 MCG/0.5ML IM SUSP
INTRAMUSCULAR | 0 refills | Status: AC
  Filled 2021-01-09: qty 0.5, 1d supply, fill #0

## 2021-01-09 NOTE — Progress Notes (Signed)
   Covid-19 Vaccination Clinic  Name:  Jennifer Heath    MRN: [id number] DOB: 1951/02/24  01/09/2021  Ms. [name] was observed post Covid-19 immunization for 15 minutes without incident. She was provided with Vaccine Information Sheet and instruction to access the V-Safe system.   Ms. [name] was instructed to call 911 with any severe reactions post vaccine: Difficulty breathing  Swelling of face and throat  A fast heartbeat  A bad rash all over body  Dizziness and weakness   Immunizations Administered     Name Date Dose VIS Date Route   Moderna Covid-19 vaccine Bivalent Booster 01/09/2021  2:39 PM 0.5 mL 10/11/2020 Intramuscular   Manufacturer: Moderna   Lot: [id number]   NDC: 39532-023-34

## 2021-04-03 DIAGNOSIS — Z1231 Encounter for screening mammogram for malignant neoplasm of breast: Secondary | ICD-10-CM | POA: Diagnosis not present

## 2021-04-03 DIAGNOSIS — Z780 Asymptomatic menopausal state: Secondary | ICD-10-CM | POA: Diagnosis not present

## 2021-04-03 DIAGNOSIS — M810 Age-related osteoporosis without current pathological fracture: Secondary | ICD-10-CM | POA: Diagnosis not present

## 2021-04-21 DIAGNOSIS — Z0000 Encounter for general adult medical examination without abnormal findings: Secondary | ICD-10-CM | POA: Diagnosis not present

## 2021-04-21 DIAGNOSIS — M810 Age-related osteoporosis without current pathological fracture: Secondary | ICD-10-CM | POA: Diagnosis not present

## 2021-04-21 DIAGNOSIS — F900 Attention-deficit hyperactivity disorder, predominantly inattentive type: Secondary | ICD-10-CM | POA: Diagnosis not present

## 2021-04-21 DIAGNOSIS — Z1389 Encounter for screening for other disorder: Secondary | ICD-10-CM | POA: Diagnosis not present

## 2021-04-21 DIAGNOSIS — E7800 Pure hypercholesterolemia, unspecified: Secondary | ICD-10-CM | POA: Diagnosis not present

## 2021-05-19 DIAGNOSIS — E559 Vitamin D deficiency, unspecified: Secondary | ICD-10-CM | POA: Diagnosis not present

## 2021-05-19 DIAGNOSIS — M810 Age-related osteoporosis without current pathological fracture: Secondary | ICD-10-CM | POA: Diagnosis not present

## 2021-05-19 DIAGNOSIS — R5383 Other fatigue: Secondary | ICD-10-CM | POA: Diagnosis not present

## 2021-05-20 DIAGNOSIS — H16141 Punctate keratitis, right eye: Secondary | ICD-10-CM | POA: Diagnosis not present

## 2021-05-21 DIAGNOSIS — M810 Age-related osteoporosis without current pathological fracture: Secondary | ICD-10-CM | POA: Diagnosis not present

## 2021-05-21 DIAGNOSIS — E559 Vitamin D deficiency, unspecified: Secondary | ICD-10-CM | POA: Diagnosis not present

## 2021-05-22 DIAGNOSIS — H16141 Punctate keratitis, right eye: Secondary | ICD-10-CM | POA: Diagnosis not present

## 2021-06-29 DIAGNOSIS — M545 Low back pain, unspecified: Secondary | ICD-10-CM | POA: Diagnosis not present

## 2021-07-22 DIAGNOSIS — H04123 Dry eye syndrome of bilateral lacrimal glands: Secondary | ICD-10-CM | POA: Diagnosis not present

## 2021-07-22 DIAGNOSIS — H25813 Combined forms of age-related cataract, bilateral: Secondary | ICD-10-CM | POA: Diagnosis not present

## 2021-07-22 DIAGNOSIS — H43813 Vitreous degeneration, bilateral: Secondary | ICD-10-CM | POA: Diagnosis not present

## 2021-07-22 DIAGNOSIS — H35371 Puckering of macula, right eye: Secondary | ICD-10-CM | POA: Diagnosis not present

## 2021-08-04 DIAGNOSIS — R5383 Other fatigue: Secondary | ICD-10-CM | POA: Diagnosis not present

## 2021-08-04 DIAGNOSIS — M810 Age-related osteoporosis without current pathological fracture: Secondary | ICD-10-CM | POA: Diagnosis not present

## 2021-08-12 DIAGNOSIS — H35371 Puckering of macula, right eye: Secondary | ICD-10-CM | POA: Diagnosis not present

## 2021-10-07 DIAGNOSIS — F900 Attention-deficit hyperactivity disorder, predominantly inattentive type: Secondary | ICD-10-CM | POA: Diagnosis not present

## 2021-10-07 DIAGNOSIS — E7800 Pure hypercholesterolemia, unspecified: Secondary | ICD-10-CM | POA: Diagnosis not present

## 2021-10-07 DIAGNOSIS — E875 Hyperkalemia: Secondary | ICD-10-CM | POA: Diagnosis not present

## 2021-10-20 ENCOUNTER — Encounter: Payer: Self-pay | Admitting: Physical Therapy

## 2021-10-20 ENCOUNTER — Ambulatory Visit: Payer: Medicare PPO | Attending: Sports Medicine | Admitting: Physical Therapy

## 2021-10-20 ENCOUNTER — Other Ambulatory Visit: Payer: Self-pay

## 2021-10-20 DIAGNOSIS — R293 Abnormal posture: Secondary | ICD-10-CM | POA: Diagnosis not present

## 2021-10-20 DIAGNOSIS — M5459 Other low back pain: Secondary | ICD-10-CM | POA: Insufficient documentation

## 2021-10-20 NOTE — Patient Instructions (Signed)
DO's and DON'T's   Avoid and/or Minimize positions of forward bending ( flexion)  Side bending and rotation of the trunk  Especially when movements occur together   When your back aches:   Don't sit down   Lie down on your back with a small pillow under your head and one under your knees or as outlined by our therapist. Or, lie in the 90/90 position ( on the floor with your feet and legs on the sofa with knees and hips bent to 90 degrees)  Tying or putting on your shoes:   Don't bend over to tie your shoes or put on socks.  Instead, bring one foot up, cross it over the opposite knee and bend forward (hinge) at the hips to so the task.  Keep your back straight.  If you cannot do this safely, then you need to use long handled assistive devices such as a shoehorn and sock puller.  Exercising:  Don't engage in ballistic types of exercise routines such as high-impact aerobics or jumping rope  Don't do exercises in the gym that bring you forward (abdominal crunches, sit-ups, touching your  toes, knee-to-chest, straight leg raising.)  Follow a regular exercise program that includes a variety of different weight-bearing activities, such as low-impact aerobics, T' ai chi or walking as your physical therapist advises  Do exercises that emphasize return to normal body alignment and strengthening of the muscles that keep your back straight, as outlined in this program or by your therapist  Household tasks:  Don't reach unnecessarily or twist your trunk when mopping, sweeping, vacuuming, raking, making beds, weeding gardens, getting objects ou of cupboards, etc.  Keep your broom, mop, vacuum, or rake close to you and mover your whole body as you move them. Walk over to the area on which you are working. Arrange kitchen, bathroom, and bedroom shelves so that frequently used items may be reached without excessive bending, twisting, and reaching.  Use a  sturdy stool if necessary.  Don't bend from the waist to pick up something up  Off the floor, out of the trunk of your car, or to brush your teeth, wash your face, etc.   Bend at the knees, keeping back straight as possible. Use a reacher if necessary.   Prevention of fracture is the so-called "BOTTOm -Line" in the management of OSTEOPOROSIS. Do not take unnecessary chances in movement. Once a compression fracture occurs, the process is very difficult to control; one fracture is frequently followed by many more.   

## 2021-10-20 NOTE — Therapy (Signed)
OUTPATIENT PHYSICAL THERAPY THORACOLUMBAR EVALUATION   Patient Name: Jennifer Heath MRN: [id number] DOB:10-13-1950, 71 y.o., female Today's Date: 10/20/2021   PT End of Session - 10/20/21 1259     Visit Number 1    Date for PT Re-Evaluation 12/15/21    Authorization Type [employer], requesting 8 visits 8/22 - 10/17    Progress Note Due on Visit 10    PT Start Time 0930    PT Stop Time 1018    PT Time Calculation (min) 48 min    Activity Tolerance Patient tolerated treatment well    Behavior During Therapy [facility] for tasks assessed/performed             Past Medical History:  Diagnosis Date   ADD (attention deficit disorder)    Depression    Hyperlipidemia    Neuropathy    Osteoporosis    Past Surgical History:  Procedure Laterality Date   COLONOSCOPY  04/01/2017   INNER EAR SURGERY  01/26/2016   TONSILLECTOMY     TOOTH EXTRACTION  04/04/2017   Patient Active Problem List   Diagnosis Date Noted   Small fiber neuropathy 05/19/2016   Paresthesia 02/16/2016   Depression 08/19/2015   Generalized anxiety disorder 08/19/2015   Memory changes 08/19/2015   ADD (attention deficit disorder) 08/19/2015    PCP: Dr. [name], MD  REFERRING PROVIDER: Albertha Ghee, MD  REFERRING DIAG: osteoporosis  Rationale for Evaluation and Treatment Rehabilitation  THERAPY DIAG:  Abnormal posture  Other low back pain  ONSET DATE: 1.5 years ago osteopenia progressed to osteoporosis in bil hips and spine  SUBJECTIVE:                                                                                                                                                                                           SUBJECTIVE STATEMENT: Pt referred to OPPT for osteoporosis eduction and desires to start an exercise program.  History of peripheral fractures approx 10 years ago from falls but none recently.  No spine compression fracture history.  No recent falls. Pt walks 30 min in AM  5 days a week. Wants to learn if she can continue to stretch and do yoga she likes to do.    PERTINENT HISTORY:  Neuropathy well controlled with diet changes falls  PAIN:  Are you having pain? No   PRECAUTIONS: Other: falls  WEIGHT BEARING RESTRICTIONS No  FALLS:  Has patient fallen in last 6 months? No, did have several falls 10 years ago with peripheral falls, no compression fractures to date  LIVING ENVIRONMENT: Lives with: lives with their family and lives alone  Lives in: House/apartment Has following equipment at home: None  OCCUPATION: retired  PLOF: Independent  PATIENT GOALS wants to start an exercise program and understand if yoga is safe   OBJECTIVE:   DIAGNOSTIC FINDINGS:  Dexa scan: osteoporosis in bil hips and spine  PATIENT SURVEYS:  Modified Oswestry 7/50, mild disability   SCREENING FOR RED FLAGS: Bowel or bladder incontinence: No Spinal tumors: No Cauda equina syndrome: No Compression fracture: No Abdominal aneurysm: No  COGNITION:  Overall cognitive status: Within functional limits for tasks assessed     SENSATION: WFL  MUSCLE LENGTH: Hamstrings: Right 45 deg; Left 50 deg Quadriceps 75% bil  POSTURE: increased thoracic kyphosis, Lt shoulder elevated  PALPATION: Non-tender  LUMBAR ROM:   Active  A/PROM  eval  Flexion Fingers to midshin  Extension 10  Right lateral flexion 10  Left lateral flexion 10  Right rotation 50%  Left rotation 50%   (Blank rows = not tested)  LOWER EXTREMITY ROM:     Passive  Right eval Left eval  Hip flexion full full  Hip extension full full  Hip abduction 35 tight adductors 35 tight adductors  Hip adduction    Hip internal rotation 12 12  Hip external rotation 65 65  Knee flexion    Knee extension    Ankle dorsiflexion    Ankle plantarflexion    Ankle inversion    Ankle eversion     (Blank rows = not tested)  LOWER EXTREMITY MMT:   4/5 bil LE globally 4/5 scapular stabilizers  bil 4/5 trunk extensors    FUNCTIONAL TESTS:  SLS: mild-mod instability  bil, Rt>Lt  GAIT: Distance walked: within clinic Assistive device utilized: None Level of assistance: Complete Independence Comments: lack of Lt arm swing    TODAY'S TREATMENT  Handout for dos and don'ts for osteoporosis HEP initiated (see below) Pt has many stretches and movements she does that are risky for spine compression fracture - discussed goals for PT to optimize movement and stretches more safely.                                              DO's and DON'T's  Avoid and/or Minimize positions of forward bending ( flexion) Side bending and rotation of the trunk Especially when movements occur together   When your back aches:  Don't sit down  Lie down on your back with a small pillow under your head and one under your knees or as outlined by our therapist. Or, lie in the 90/90 position ( on the floor with your feet and legs on the sofa with knees and hips bent to 90 degrees)  Tying or putting on your shoes:  Don't bend over to tie your shoes or put on socks. Instead, bring one foot up, cross it over the opposite knee and bend forward (hinge) at the hips to so the task.  Keep your back straight.  If you cannot do this safely, then you need to use long handled assistive devices such as a shoehorn and sock puller.  Exercising: Don't engage in ballistic types of exercise routines such as high-impact aerobics or jumping rope Don't do exercises in the gym that bring you forward (abdominal crunches, sit-ups, touching your  toes, knee-to-chest, straight leg raising.) Follow a regular exercise program that includes a variety of different weight-bearing activities, such as low-impact aerobics,  T' ai chi or walking as your physical therapist advises Do exercises that emphasize return to normal body alignment and strengthening of the muscles that keep your back straight, as outlined in this program or by your  therapist  Household tasks: Don't reach unnecessarily or twist your trunk when mopping, sweeping, vacuuming, raking, making beds, weeding gardens, getting objects ou of cupboards, etc. Keep your broom, mop, vacuum, or rake close to you and mover your whole body as you move them. Walk over to the area on which you are working. Arrange kitchen, bathroom, and bedroom shelves so that frequently used items may be reached without excessive bending, twisting, and reaching.  Use a sturdy stool if necessary. Don't bend from the waist to pick up something up  Off the floor, out of the trunk of your car, or to brush your teeth, wash your face, etc.  Bend at the knees, keeping back straight as possible. Use a reacher if necessary.   Prevention of fracture is the so-called "BOTTOm -Line" in the management of OSTEOPOROSIS. Do not take unnecessary chances in movement. Once a compression fracture occurs, the process is very difficult to control; one fracture is frequently followed by many more.     PATIENT EDUCATION:  Education details: Access Code: [email or web address] Person educated: Patient Education method: Programmer, multimedia, Demonstration, Verbal cues, and Handouts Education comprehension: verbalized understanding and returned demonstration   HOME EXERCISE PROGRAM: Access Code: [email or web address] URL: [email or web address] Date: 10/20/2021 Prepared by: Loistine Simas Royce Stegman  Exercises - Supine Hamstring Stretch with Strap  - 1 x daily - 7 x weekly - 1 sets - 3 reps - 30 hold - Supine Hamstring Stretch  - 1 x daily - 7 x weekly - 1 sets - 3 reps - 30 hold - Hip Adductors and Hamstring Stretch with Strap  - 1 x daily - 7 x weekly - 1 sets - 3 reps - 30 hold - Standing Bilateral Low Shoulder Row with Anchored Resistance  - 1 x daily - 7 x weekly - 2 sets - 10 reps - Supine Shoulder Horizontal Abduction with Resistance  - 1 x daily - 7 x weekly - 2 sets - 10 reps  ASSESSMENT:  CLINICAL IMPRESSION: Patient is a 71  y.o. female who was seen today for physical therapy evaluation and treatment for osteoporosis education and development of a safe exercise program.  Pt has desires to do yoga and other stretches/movements that are unsafe for spinal health so she will need revision of approach for proper/safe mobility and stretching.  She has thoracic kyphosis, limited trunk ROM, and signif flexibility restrictions in bil hamstrings, quads and adductors.  She has 4/5 global strength in back extensors, bil LE and scapular stabilizers.  She has history of Rt sided LBP and Lt shoulder pain to monitor with therex progression.  HEP intiated today and handout given for dos and don'ts for osteoporosis.  Will need to review questions regarding her handout next visit as session didn't allow enough time for all of Pt's questions.      OBJECTIVE IMPAIRMENTS decreased balance, decreased mobility, decreased ROM, decreased strength, hypomobility, impaired flexibility, improper body mechanics, postural dysfunction, and pain.   ACTIVITY LIMITATIONS bending, sitting, squatting, and transfers  PARTICIPATION LIMITATIONS: cleaning, laundry, community activity, and yard work  PERSONAL FACTORS Age, Time since onset of injury/illness/exacerbation, and 1-2 comorbidities: history of falls, history of peripheral fractures, osteoporosis  are also affecting patient's functional outcome.   REHAB POTENTIAL: Excellent  CLINICAL DECISION MAKING: Stable/uncomplicated  EVALUATION  COMPLEXITY: Low   GOALS: Goals reviewed with patient? Yes  SHORT TERM GOALS: Target date: 11/17/2021  Pt will be ind with initial HEP Baseline: Goal status: INITIAL  2.  Pt will understand and adjust daily activities and exercises around dos and don'ts for osteoporosis.   Baseline:  Goal status: INITIAL    LONG TERM GOALS: Target date: 12/15/2021  Pt will be ind with advanced HEP to optimize mobility, strength and functional mobility in ways that are safe  for her spine secondary to osteoporosis Baseline:  Goal status: INITIAL  2.  Pt will achieve at least 65 deg bil hamstring length to reduce threat of LBP with movement Baseline:  Goal status: INITIAL  3.  Pt will demo proper body mechanics for squat/lift and household tasks such as vacuuming for spine health.  Baseline:  Goal status: INITIAL  4. Pt will achieve at least 4+/5 for back extensors, scapular stabilizers and bil LE for improved posture with functional task performance. Baseline:  Goal status: INITIAL     PLAN: PT FREQUENCY: 2x/week  PT DURATION: 8 weeks (Pt will be out of town for first 3 weeks of certification period, goal is 8 visits)  PLANNED INTERVENTIONS: Therapeutic exercises, Therapeutic activity, Neuromuscular re-education, Balance training, and Self Care.  PLAN FOR NEXT SESSION: review HEP, answer questions about dos and don'ts for osteo handout given at eval, progress safe ways for spine mobility/stretching, progress functional strength and posture   Aeron Lheureux, PT 10/20/21 1:21 PM

## 2021-10-22 DIAGNOSIS — Z800 Family history of malignant neoplasm of digestive organs: Secondary | ICD-10-CM | POA: Diagnosis not present

## 2021-10-22 DIAGNOSIS — L719 Rosacea, unspecified: Secondary | ICD-10-CM | POA: Diagnosis not present

## 2021-10-22 DIAGNOSIS — L720 Epidermal cyst: Secondary | ICD-10-CM | POA: Diagnosis not present

## 2021-10-22 DIAGNOSIS — D239 Other benign neoplasm of skin, unspecified: Secondary | ICD-10-CM | POA: Diagnosis not present

## 2021-10-22 DIAGNOSIS — Z86018 Personal history of other benign neoplasm: Secondary | ICD-10-CM | POA: Diagnosis not present

## 2021-10-22 DIAGNOSIS — L578 Other skin changes due to chronic exposure to nonionizing radiation: Secondary | ICD-10-CM | POA: Diagnosis not present

## 2021-10-22 DIAGNOSIS — D225 Melanocytic nevi of trunk: Secondary | ICD-10-CM | POA: Diagnosis not present

## 2021-10-22 DIAGNOSIS — Z808 Family history of malignant neoplasm of other organs or systems: Secondary | ICD-10-CM | POA: Diagnosis not present

## 2021-10-22 DIAGNOSIS — L821 Other seborrheic keratosis: Secondary | ICD-10-CM | POA: Diagnosis not present

## 2021-11-23 ENCOUNTER — Ambulatory Visit: Payer: Medicare PPO

## 2021-11-25 ENCOUNTER — Ambulatory Visit: Payer: Medicare PPO | Admitting: Physical Therapy

## 2021-11-25 DIAGNOSIS — H35371 Puckering of macula, right eye: Secondary | ICD-10-CM | POA: Diagnosis not present

## 2021-12-01 ENCOUNTER — Ambulatory Visit: Payer: Medicare PPO | Attending: Sports Medicine

## 2021-12-01 DIAGNOSIS — M5459 Other low back pain: Secondary | ICD-10-CM | POA: Insufficient documentation

## 2021-12-01 DIAGNOSIS — R293 Abnormal posture: Secondary | ICD-10-CM | POA: Insufficient documentation

## 2021-12-01 NOTE — Therapy (Signed)
OUTPATIENT PHYSICAL THERAPY TREATMENT   Patient Name: Jennifer Heath MRN: [id number] DOB:1950-11-15, 71 y.o., female Today's Date: 12/01/2021   PT End of Session - 12/01/21 1013     Visit Number 2    Date for PT Re-Evaluation 12/15/21    Authorization Type [employer], requesting 8 visits 8/22 - 10/17    Progress Note Due on Visit 10    PT Start Time 0932    PT Stop Time 1015    PT Time Calculation (min) 43 min    Activity Tolerance Patient tolerated treatment well    Behavior During Therapy [name] for tasks assessed/performed              Past Medical History:  Diagnosis Date   ADD (attention deficit disorder)    Depression    Hyperlipidemia    Neuropathy    Osteoporosis    Past Surgical History:  Procedure Laterality Date   COLONOSCOPY  04/01/2017   INNER EAR SURGERY  01/26/2016   TONSILLECTOMY     TOOTH EXTRACTION  04/04/2017   Patient Active Problem List   Diagnosis Date Noted   Small fiber neuropathy 05/19/2016   Paresthesia 02/16/2016   Depression 08/19/2015   Generalized anxiety disorder 08/19/2015   Memory changes 08/19/2015   ADD (attention deficit disorder) 08/19/2015    PCP: Dr. [name], MD  REFERRING PROVIDER: Albertha Ghee, MD  REFERRING DIAG: osteoporosis  Rationale for Evaluation and Treatment Rehabilitation  THERAPY DIAG:  Abnormal posture  Other low back pain  ONSET DATE: 1.5 years ago osteopenia progressed to osteoporosis in bil hips and spine  SUBJECTIVE:                                                                                                                                                                                           SUBJECTIVE STATEMENT: I haven't been doing much of the exercise.  I was traveling and taking care of my granddaughter.    PERTINENT HISTORY:  Neuropathy well controlled with diet changes falls  PAIN:  Are you having pain? No  PRECAUTIONS: Other: falls  WEIGHT BEARING  RESTRICTIONS No  FALLS:  Has patient fallen in last 6 months? No, did have several falls 10 years ago with peripheral falls, no compression fractures to date  LIVING ENVIRONMENT: Lives with: lives with their family and lives alone Lives in: House/apartment Has following equipment at home: None  OCCUPATION: retired  PLOF: Independent  PATIENT GOALS wants to start an exercise program and understand if yoga is safe   OBJECTIVE:   DIAGNOSTIC FINDINGS:  Dexa scan: osteoporosis in bil hips and spine  PATIENT SURVEYS:  Modified Oswestry 7/50, mild disability   SCREENING FOR RED FLAGS: Bowel or bladder incontinence: No Spinal tumors: No Cauda equina syndrome: No Compression fracture: No Abdominal aneurysm: No  COGNITION:  Overall cognitive status: Within functional limits for tasks assessed     SENSATION: WFL  MUSCLE LENGTH: Hamstrings: Right 45 deg; Left 50 deg Quadriceps 75% bil  POSTURE: increased thoracic kyphosis, Lt shoulder elevated  PALPATION: Non-tender  LUMBAR ROM:   Active  A/PROM  eval  Flexion Fingers to midshin  Extension 10  Right lateral flexion 10  Left lateral flexion 10  Right rotation 50%  Left rotation 50%   (Blank rows = not tested)  LOWER EXTREMITY ROM:     Passive  Right eval Left eval  Hip flexion full full  Hip extension full full  Hip abduction 35 tight adductors 35 tight adductors  Hip adduction    Hip internal rotation 12 12  Hip external rotation 65 65  Knee flexion    Knee extension    Ankle dorsiflexion    Ankle plantarflexion    Ankle inversion    Ankle eversion     (Blank rows = not tested)  LOWER EXTREMITY MMT:   4/5 bil LE globally 4/5 scapular stabilizers bil 4/5 trunk extensors   FUNCTIONAL TESTS:  SLS: mild-mod instability  bil, Rt>Lt  GAIT: Distance walked: within clinic Assistive device utilized: None Level of assistance: Complete Independence Comments: lack of Lt arm swing    TODAY'S  TREATMENT  Date:  12/01/21 Discussion regarding body mechanics modifications and how to make changes to current movement patterns. Answered pt questions. Supine hamstring stretch with strap: 2x30 seconds bil each Hip adductor stretch with strap x20 seconds  Supine shoulder horizontal abduction red band 2x10 Sit to stand 2x10 Long arc quads 5" hold x10 Standing row: red band 2x10  10/20/21:  Handout for dos and don'ts for osteoporosis HEP initiated (see below) Pt has many stretches and movements she does that are risky for spine compression fracture - discussed goals for PT to optimize movement and stretches more safely.                                              DO's and DON'T's  Avoid and/or Minimize positions of forward bending ( flexion) Side bending and rotation of the trunk Especially when movements occur together   When your back aches:  Don't sit down  Lie down on your back with a small pillow under your head and one under your knees or as outlined by our therapist. Or, lie in the 90/90 position ( on the floor with your feet and legs on the sofa with knees and hips bent to 90 degrees)  Tying or putting on your shoes:  Don't bend over to tie your shoes or put on socks. Instead, bring one foot up, cross it over the opposite knee and bend forward (hinge) at the hips to so the task.  Keep your back straight.  If you cannot do this safely, then you need to use long handled assistive devices such as a shoehorn and sock puller.  Exercising: Don't engage in ballistic types of exercise routines such as high-impact aerobics or jumping rope Don't do exercises in the gym that bring you forward (abdominal crunches, sit-ups, touching your  toes, knee-to-chest, straight leg raising.) Follow a regular  exercise program that includes a variety of different weight-bearing activities, such as low-impact aerobics, T' ai chi or walking as your physical therapist advises Do exercises that emphasize  return to normal body alignment and strengthening of the muscles that keep your back straight, as outlined in this program or by your therapist  Household tasks: Don't reach unnecessarily or twist your trunk when mopping, sweeping, vacuuming, raking, making beds, weeding gardens, getting objects ou of cupboards, etc. Keep your broom, mop, vacuum, or rake close to you and mover your whole body as you move them. Walk over to the area on which you are working. Arrange kitchen, bathroom, and bedroom shelves so that frequently used items may be reached without excessive bending, twisting, and reaching.  Use a sturdy stool if necessary. Don't bend from the waist to pick up something up  Off the floor, out of the trunk of your car, or to brush your teeth, wash your face, etc.  Bend at the knees, keeping back straight as possible. Use a reacher if necessary.   Prevention of fracture is the so-called "BOTTOm -Line" in the management of OSTEOPOROSIS. Do not take unnecessary chances in movement. Once a compression fracture occurs, the process is very difficult to control; one fracture is frequently followed by many more.     PATIENT EDUCATION:  Education details: Access Code: [email or web address] Person educated: Patient Education method: Programmer, multimedia, Demonstration, Verbal cues, and Handouts Education comprehension: verbalized understanding and returned demonstration   HOME EXERCISE PROGRAM: Access Code: [email or web address] URL: [email or web address] Date: 12/01/2021 Prepared by: Tresa Endo  Exercises - Supine Hamstring Stretch with Strap  - 1 x daily - 7 x weekly - 1 sets - 3 reps - 30 hold - Supine Hamstring Stretch  - 1 x daily - 7 x weekly - 1 sets - 3 reps - 30 hold - Hip Adductors and Hamstring Stretch with Strap  - 1 x daily - 7 x weekly - 1 sets - 3 reps - 30 hold - Standing Bilateral Low Shoulder Row with Anchored Resistance  - 1 x daily - 7 x weekly - 2 sets - 10 reps - Supine Shoulder Horizontal  Abduction with Resistance  - 1 x daily - 7 x weekly - 2 sets - 10 reps - Sit to Stand Without Arm Support  - 2 x daily - 7 x weekly - 2 sets - 10 reps - Seated Long Arc Quad  - 2 x daily - 7 x weekly - 2 sets - 10 reps  ASSESSMENT:  CLINICAL IMPRESSION: First time follow-up after evaluation.  Session spent educating pt on body mechanics modifications for daily tasks to reduce fracture risk.  Pt reports that she has not been doing any of her exercises.  Session spent reviewing all HEP and PT added strength exercises for core and legs to assist with body mechanics modifications.  Pt required tactile and verbal cues for technique and alignment.  Patient will benefit from skilled PT to address the below impairments and improve overall function.    OBJECTIVE IMPAIRMENTS decreased balance, decreased mobility, decreased ROM, decreased strength, hypomobility, impaired flexibility, improper body mechanics, postural dysfunction, and pain.   ACTIVITY LIMITATIONS bending, sitting, squatting, and transfers  PARTICIPATION LIMITATIONS: cleaning, laundry, community activity, and yard work  PERSONAL FACTORS Age, Time since onset of injury/illness/exacerbation, and 1-2 comorbidities: history of falls, history of peripheral fractures, osteoporosis  are also affecting patient's functional outcome.   REHAB POTENTIAL: Excellent  CLINICAL DECISION MAKING: Stable/uncomplicated  EVALUATION COMPLEXITY: Low  GOALS: Goals reviewed with patient? Yes  SHORT TERM GOALS: Target date: 11/17/21  Pt will be ind with initial HEP Baseline: Goal status: INITIAL  2.  Pt will understand and adjust daily activities and exercises around dos and don'ts for osteoporosis.   Baseline:  Goal status: INITIAL    LONG TERM GOALS: Target date: 12/15/21  Pt will be ind with advanced HEP to optimize mobility, strength and functional mobility in ways that are safe for her spine secondary to osteoporosis Baseline:  Goal  status: INITIAL  2.  Pt will achieve at least 65 deg bil hamstring length to reduce threat of LBP with movement Baseline:  Goal status: INITIAL  3.  Pt will demo proper body mechanics for squat/lift and household tasks such as vacuuming for spine health.  Baseline:  Goal status: INITIAL  4. Pt will achieve at least 4+/5 for back extensors, scapular stabilizers and bil LE for improved posture with functional task performance. Baseline:  Goal status: INITIAL     PLAN: PT FREQUENCY: 2x/week  PT DURATION: 8 weeks (Pt will be out of town for first 3 weeks of certification period, goal is 8 visits)  PLANNED INTERVENTIONS: Therapeutic exercises, Therapeutic activity, Neuromuscular re-education, Balance training, and Self Care.  PLAN FOR NEXT SESSION: Add strength to HEP as indicated, answer pt question    Sigurd Sos, PT 12/01/21 10:15 AM

## 2021-12-03 ENCOUNTER — Ambulatory Visit: Payer: Medicare PPO

## 2021-12-03 DIAGNOSIS — R293 Abnormal posture: Secondary | ICD-10-CM | POA: Diagnosis not present

## 2021-12-03 DIAGNOSIS — M5459 Other low back pain: Secondary | ICD-10-CM | POA: Diagnosis not present

## 2021-12-03 NOTE — Therapy (Signed)
OUTPATIENT PHYSICAL THERAPY TREATMENT   Patient Name: Jennifer Heath MRN: [id number] DOB:07/13/1950, 71 y.o., female Today's Date: 12/03/2021   PT End of Session - 12/03/21 1009     Visit Number 3    Date for PT Re-Evaluation 12/15/21    Authorization Type [employer], aproved 8 visits 8/22 - 10/17    Authorization - Visit Number 3    Authorization - Number of Visits 8    PT Start Time 0932    PT Stop Time 1010    PT Time Calculation (min) 38 min    Activity Tolerance Patient tolerated treatment well    Behavior During Therapy [facility] for tasks assessed/performed               Past Medical History:  Diagnosis Date   ADD (attention deficit disorder)    Depression    Hyperlipidemia    Neuropathy    Osteoporosis    Past Surgical History:  Procedure Laterality Date   COLONOSCOPY  04/01/2017   INNER EAR SURGERY  01/26/2016   TONSILLECTOMY     TOOTH EXTRACTION  04/04/2017   Patient Active Problem List   Diagnosis Date Noted   Small fiber neuropathy 05/19/2016   Paresthesia 02/16/2016   Depression 08/19/2015   Generalized anxiety disorder 08/19/2015   Memory changes 08/19/2015   ADD (attention deficit disorder) 08/19/2015    PCP: Dr. Carol Ada, MD  REFERRING PROVIDER: Wandra Feinstein, MD  REFERRING DIAG: osteoporosis  Rationale for Evaluation and Treatment Rehabilitation  THERAPY DIAG:  Abnormal posture  Other low back pain  ONSET DATE: 1.5 years ago osteopenia progressed to osteoporosis in bil hips and spine  SUBJECTIVE:                                                                                                                                                                                           SUBJECTIVE STATEMENT: I did my exercises and they are going well.    PERTINENT HISTORY:  Neuropathy well controlled with diet changes falls  PAIN:  Are you having pain? No  PRECAUTIONS: Other: falls  WEIGHT BEARING RESTRICTIONS  No  FALLS:  Has patient fallen in last 6 months? No, did have several falls 10 years ago with peripheral falls, no compression fractures to date  LIVING ENVIRONMENT: Lives with: lives with their family and lives alone Lives in: House/apartment Has following equipment at home: None  OCCUPATION: retired  PLOF: [place] wants to start an exercise program and understand if yoga is safe   OBJECTIVE:   DIAGNOSTIC FINDINGS:  Dexa scan: osteoporosis in bil hips and spine  PATIENT SURVEYS:  Modified Oswestry 7/50, mild disability   SCREENING FOR RED FLAGS: Bowel or bladder incontinence: No Spinal tumors: No Cauda equina syndrome: No Compression fracture: No Abdominal aneurysm: No  COGNITION:  Overall cognitive status: Within functional limits for tasks assessed     SENSATION: WFL  MUSCLE LENGTH: Hamstrings: Right 45 deg; Left 50 deg Quadriceps 75% bil  POSTURE: increased thoracic kyphosis, Lt shoulder elevated  PALPATION: Non-tender  LUMBAR ROM:   Active  A/PROM  eval  Flexion Fingers to midshin  Extension 10  Right lateral flexion 10  Left lateral flexion 10  Right rotation 50%  Left rotation 50%   (Blank rows = not tested)  LOWER EXTREMITY ROM:     Passive  Right eval Left eval  Hip flexion full full  Hip extension full full  Hip abduction 35 tight adductors 35 tight adductors  Hip adduction    Hip internal rotation 12 12  Hip external rotation 65 65  Knee flexion    Knee extension    Ankle dorsiflexion    Ankle plantarflexion    Ankle inversion    Ankle eversion     (Blank rows = not tested)  LOWER EXTREMITY MMT:   4/5 bil LE globally 4/5 scapular stabilizers bil 4/5 trunk extensors   FUNCTIONAL TESTS:  SLS: mild-mod instability  bil, Rt>Lt  GAIT: Distance walked: within clinic Assistive device utilized: None Level of assistance: Complete Independence Comments: lack of Lt arm swing    TODAY'S TREATMENT    Date:  12/03/21 Arm bike: Level 1 x 6 minutes -PT present to discuss progress  Supine hamstring stretch with strap: 2x30 seconds bil each Hip adductor stretch with strap x20 seconds  Supine shoulder horizontal abduction red band 2x10 Sit to stand 2x10 Long arc quads 5" hold x10 Standing row: red band 2x10 Date:  12/01/21 Discussion regarding body mechanics modifications and how to make changes to current movement patterns. Answered pt questions. Supine hamstring stretch with strap: 2x30 seconds bil each Hip adductor stretch with strap x20 seconds  Supine shoulder horizontal abduction red band 2x10 Sit to stand 2x10 Long arc quads 5" hold x10 Standing row: red band 2x10  10/20/21:  Handout for dos and don'ts for osteoporosis HEP initiated (see below) Pt has many stretches and movements she does that are risky for spine compression fracture - discussed goals for PT to optimize movement and stretches more safely.                                              DO's and DON'T's  Avoid and/or Minimize positions of forward bending ( flexion) Side bending and rotation of the trunk Especially when movements occur together   When your back aches:  Don't sit down  Lie down on your back with a small pillow under your head and one under your knees or as outlined by our therapist. Or, lie in the 90/90 position ( on the floor with your feet and legs on the sofa with knees and hips bent to 90 degrees)  Tying or putting on your shoes:  Don't bend over to tie your shoes or put on socks. Instead, bring one foot up, cross it over the opposite knee and bend forward (hinge) at the hips to so the task.  Keep your back straight.  If you cannot do this safely, then  you need to use long handled assistive devices such as a shoehorn and sock puller.  Exercising: Don't engage in ballistic types of exercise routines such as high-impact aerobics or jumping rope Don't do exercises in the gym that bring you  forward (abdominal crunches, sit-ups, touching your  toes, knee-to-chest, straight leg raising.) Follow a regular exercise program that includes a variety of different weight-bearing activities, such as low-impact aerobics, T' ai chi or walking as your physical therapist advises Do exercises that emphasize return to normal body alignment and strengthening of the muscles that keep your back straight, as outlined in this program or by your therapist  Household tasks: Don't reach unnecessarily or twist your trunk when mopping, sweeping, vacuuming, raking, making beds, weeding gardens, getting objects ou of cupboards, etc. Keep your broom, mop, vacuum, or rake close to you and mover your whole body as you move them. Walk over to the area on which you are working. Arrange kitchen, bathroom, and bedroom shelves so that frequently used items may be reached without excessive bending, twisting, and reaching.  Use a sturdy stool if necessary. Don't bend from the waist to pick up something up  Off the floor, out of the trunk of your car, or to brush your teeth, wash your face, etc.  Bend at the knees, keeping back straight as possible. Use a reacher if necessary.   Prevention of fracture is the so-called "BOTTOm -Line" in the management of OSTEOPOROSIS. Do not take unnecessary chances in movement. Once a compression fracture occurs, the process is very difficult to control; one fracture is frequently followed by many more.     PATIENT EDUCATION:  Education details: Access Code: [email or web address] Person educated: Patient Education method: Consulting civil engineer, Demonstration, Verbal cues, and Handouts Education comprehension: verbalized understanding and returned demonstration   HOME EXERCISE PROGRAM: Access Code: [email or web address] URL: [email or web address] Date: 12/03/2021 Prepared by: Claiborne Billings  Exercises - Supine Hamstring Stretch with Strap  - 1 x daily - 7 x weekly - 1 sets - 3 reps - 30 hold - Supine Hamstring  Stretch  - 1 x daily - 7 x weekly - 1 sets - 3 reps - 30 hold - Hip Adductors and Hamstring Stretch with Strap  - 1 x daily - 7 x weekly - 1 sets - 3 reps - 30 hold - Standing Bilateral Low Shoulder Row with Anchored Resistance  - 1 x daily - 7 x weekly - 2 sets - 10 reps - Supine Shoulder Horizontal Abduction with Resistance  - 1 x daily - 7 x weekly - 2 sets - 10 reps - Sit to Stand Without Arm Support  - 2 x daily - 7 x weekly - 2 sets - 5 reps - Seated Long Arc Quad  - 2 x daily - 7 x weekly - 2 sets - 10 reps - Seated Hamstring Stretch  - 2 x daily - 7 x weekly - 1 sets - 3 reps - 20 hold - Seated Piriformis Stretch with Trunk Bend  - 2 x daily - 7 x weekly - 1 sets - 10 reps - 20 hold - Supine Bilateral Shoulder External Rotation with Resistance  - 1 x daily - 7 x weekly - 2 sets - 10 reps - Supine PNF D2 Flexion with Resistance  - 1 x daily - 7 x weekly - 2 sets - 10 reps ASSESSMENT:  CLINICAL IMPRESSION: Pt reports that she has been doing her HEP and it is going well.    PT added  additional postural strength exercises to HEP and pt required minor tactile cues for scapular depression.  PT answered pt questions regarding exercises as needed.  Patient will benefit from skilled PT to address the below impairments and improve overall function.    OBJECTIVE IMPAIRMENTS decreased balance, decreased mobility, decreased ROM, decreased strength, hypomobility, impaired flexibility, improper body mechanics, postural dysfunction, and pain.   ACTIVITY LIMITATIONS bending, sitting, squatting, and transfers  PARTICIPATION LIMITATIONS: cleaning, laundry, community activity, and yard work  PERSONAL FACTORS Age, Time since onset of injury/illness/exacerbation, and 1-2 comorbidities: history of falls, history of peripheral fractures, osteoporosis  are also affecting patient's functional outcome.   REHAB POTENTIAL: Excellent  CLINICAL DECISION MAKING: Stable/uncomplicated  EVALUATION COMPLEXITY:  Low   GOALS: Goals reviewed with patient? Yes  SHORT TERM GOALS: Target date: 11/17/21  Pt will be ind with initial HEP Baseline: Goal status: INITIAL  2.  Pt will understand and adjust daily activities and exercises around dos and don'ts for osteoporosis.   Baseline:  Goal status: INITIAL    LONG TERM GOALS: Target date: 12/15/21  Pt will be ind with advanced HEP to optimize mobility, strength and functional mobility in ways that are safe for her spine secondary to osteoporosis Baseline:  Goal status: INITIAL  2.  Pt will achieve at least 65 deg bil hamstring length to reduce threat of LBP with movement Baseline:  Goal status: INITIAL  3.  Pt will demo proper body mechanics for squat/lift and household tasks such as vacuuming for spine health.  Baseline:  Goal status: INITIAL  4. Pt will achieve at least 4+/5 for back extensors, scapular stabilizers and bil LE for improved posture with functional task performance. Baseline:  Goal status: INITIAL     PLAN: PT FREQUENCY: 2x/week  PT DURATION: 8 weeks (Pt will be out of town for first 3 weeks of certification period, goal is 8 visits)  PLANNED INTERVENTIONS: Therapeutic exercises, Therapeutic activity, Neuromuscular re-education, Balance training, and Self Care.  PLAN FOR NEXT SESSION: review new HEP, continue strength and posture progression.  Add core to HEP   [employer], PT 12/03/21 10:10 AM

## 2021-12-08 ENCOUNTER — Encounter: Payer: Self-pay | Admitting: Physical Therapy

## 2021-12-08 ENCOUNTER — Ambulatory Visit: Payer: Medicare PPO | Admitting: Physical Therapy

## 2021-12-08 DIAGNOSIS — R293 Abnormal posture: Secondary | ICD-10-CM

## 2021-12-08 DIAGNOSIS — M5459 Other low back pain: Secondary | ICD-10-CM | POA: Diagnosis not present

## 2021-12-08 NOTE — Therapy (Signed)
OUTPATIENT PHYSICAL THERAPY TREATMENT   Patient Name: Jennifer Heath MRN: [id number] DOB:[birth year], 71 y.o., female Today's Date: 12/08/2021   PT End of Session - 12/08/21 0933     Visit Number 4    Date for PT Re-Evaluation 12/15/21    Authorization Type [employer], aproved 8 visits 8/22 - 10/17    Authorization - Visit Number 4    Authorization - Number of Visits 8    Progress Note Due on Visit 10    PT Start Time 0933    PT Stop Time 1014    PT Time Calculation (min) 41 min    Activity Tolerance Patient tolerated treatment well    Behavior During Therapy [facility] for tasks assessed/performed                Past Medical History:  Diagnosis Date   ADD (attention deficit disorder)    Depression    Hyperlipidemia    Neuropathy    Osteoporosis    Past Surgical History:  Procedure Laterality Date   COLONOSCOPY  04/01/2017   INNER EAR SURGERY  01/26/2016   TONSILLECTOMY     TOOTH EXTRACTION  04/04/2017   Patient Active Problem List   Diagnosis Date Noted   Small fiber neuropathy 05/19/2016   Paresthesia 02/16/2016   Depression 08/19/2015   Generalized anxiety disorder 08/19/2015   Memory changes 08/19/2015   ADD (attention deficit disorder) 08/19/2015    PCP: Dr. [name], MD  REFERRING PROVIDER: Albertha Ghee, MD  REFERRING DIAG: osteoporosis  Rationale for Evaluation and Treatment Rehabilitation  THERAPY DIAG:  Abnormal posture  Other low back pain  ONSET DATE: 1.5 years ago osteopenia progressed to osteoporosis in bil hips and spine  SUBJECTIVE:                                                                                                                                                                                           SUBJECTIVE STATEMENT: I have been adjusting to how I do yard work.  My knees have been achy when I bend them since starting PT.   PERTINENT HISTORY:  Neuropathy well controlled with diet  changes falls  PAIN:  Are you having pain? No  PRECAUTIONS: Other: falls  WEIGHT BEARING RESTRICTIONS No  FALLS:  Has patient fallen in last 6 months? No, did have several falls 10 years ago with peripheral falls, no compression fractures to date  LIVING ENVIRONMENT: Lives with: lives with their family and lives alone Lives in: House/apartment Has following equipment at home: None  OCCUPATION: retired  PLOF: Independent  PATIENT GOALS wants to start an exercise program  and understand if yoga is safe   OBJECTIVE:   DIAGNOSTIC FINDINGS:  Dexa scan: osteoporosis in bil hips and spine  PATIENT SURVEYS:  Modified Oswestry 7/50, mild disability   SCREENING FOR RED FLAGS: Bowel or bladder incontinence: No Spinal tumors: No Cauda equina syndrome: No Compression fracture: No Abdominal aneurysm: No  COGNITION:  Overall cognitive status: Within functional limits for tasks assessed     SENSATION: WFL  MUSCLE LENGTH: Hamstrings: Right 45 deg; Left 50 deg Quadriceps 75% bil  POSTURE: increased thoracic kyphosis, Lt shoulder elevated  PALPATION: Non-tender  LUMBAR ROM:   Active  A/PROM  eval  Flexion Fingers to midshin  Extension 10  Right lateral flexion 10  Left lateral flexion 10  Right rotation 50%  Left rotation 50%   (Blank rows = not tested)  LOWER EXTREMITY ROM:     Passive  Right eval Left eval  Hip flexion full full  Hip extension full full  Hip abduction 35 tight adductors 35 tight adductors  Hip adduction    Hip internal rotation 12 12  Hip external rotation 65 65  Knee flexion    Knee extension    Ankle dorsiflexion    Ankle plantarflexion    Ankle inversion    Ankle eversion     (Blank rows = not tested)  LOWER EXTREMITY MMT:   4/5 bil LE globally 4/5 scapular stabilizers bil 4/5 trunk extensors   FUNCTIONAL TESTS:  SLS: mild-mod instability  bil, Rt>Lt  GAIT: Distance walked: within clinic Assistive device utilized:  None Level of assistance: Complete Independence Comments: lack of Lt arm swing    TODAY'S TREATMENT  Date: 12/08/21 NuStep L5 x 5' PT present to discuss progress Seated hamstring stretch 2x30" with VC and demo to keep back straight Supine shoulder horizontal abduction red band 2x10 Supine diagonal draw the sword 2x10 red band Hip adductor stretch with strap x20 seconds  Sit to stand 2x10 Long arc quads 5" hold x10 Standing row: red band 2x10 Counter hip abd and ext 2x5 each bil Counter heel raises bil 2x10    Date:  12/03/21 Arm bike: Level 1 x 6 minutes -PT present to discuss progress  Supine hamstring stretch with strap: 2x30 seconds bil each Hip adductor stretch with strap x20 seconds  Supine shoulder horizontal abduction red band 2x10 Sit to stand 2x10 Long arc quads 5" hold x10 Standing row: red band 2x10 Date:  12/01/21 Discussion regarding body mechanics modifications and how to make changes to current movement patterns. Answered pt questions. Supine hamstring stretch with strap: 2x30 seconds bil each Hip adductor stretch with strap x20 seconds  Supine shoulder horizontal abduction red band 2x10 Sit to stand 2x10 Long arc quads 5" hold x10 Standing row: red band 2x10                                              DO's and DON'T's  Avoid and/or Minimize positions of forward bending ( flexion) Side bending and rotation of the trunk Especially when movements occur together   When your back aches:  Don't sit down  Lie down on your back with a small pillow under your head and one under your knees or as outlined by our therapist. Or, lie in the 90/90 position ( on the floor with your feet and legs on the sofa with knees and hips  bent to 90 degrees)  Tying or putting on your shoes:  Don't bend over to tie your shoes or put on socks. Instead, bring one foot up, cross it over the opposite knee and bend forward (hinge) at the hips to so the task.  Keep your back straight.   If you cannot do this safely, then you need to use long handled assistive devices such as a shoehorn and sock puller.  Exercising: Don't engage in ballistic types of exercise routines such as high-impact aerobics or jumping rope Don't do exercises in the gym that bring you forward (abdominal crunches, sit-ups, touching your  toes, knee-to-chest, straight leg raising.) Follow a regular exercise program that includes a variety of different weight-bearing activities, such as low-impact aerobics, T' ai chi or walking as your physical therapist advises Do exercises that emphasize return to normal body alignment and strengthening of the muscles that keep your back straight, as outlined in this program or by your therapist  Household tasks: Don't reach unnecessarily or twist your trunk when mopping, sweeping, vacuuming, raking, making beds, weeding gardens, getting objects ou of cupboards, etc. Keep your broom, mop, vacuum, or rake close to you and mover your whole body as you move them. Walk over to the area on which you are working. Arrange kitchen, bathroom, and bedroom shelves so that frequently used items may be reached without excessive bending, twisting, and reaching.  Use a sturdy stool if necessary. Don't bend from the waist to pick up something up  Off the floor, out of the trunk of your car, or to brush your teeth, wash your face, etc.  Bend at the knees, keeping back straight as possible. Use a reacher if necessary.   Prevention of fracture is the so-called "BOTTOm -Line" in the management of OSTEOPOROSIS. Do not take unnecessary chances in movement. Once a compression fracture occurs, the process is very difficult to control; one fracture is frequently followed by many more.     PATIENT EDUCATION:  Education details: Access Code: [email or web address] Person educated: Patient Education method: Programmer, multimedia, Demonstration, Verbal cues, and Handouts Education comprehension: verbalized understanding and  returned demonstration   HOME EXERCISE PROGRAM: Access Code: [email or web address] URL: [email or web address] Date: 12/08/2021 Prepared by: Loistine Simas Rudi Knippenberg  Exercises - Supine Hamstring Stretch with Strap  - 1 x daily - 7 x weekly - 1 sets - 3 reps - 30 hold - Supine Hamstring Stretch  - 1 x daily - 7 x weekly - 1 sets - 3 reps - 30 hold - Hip Adductors and Hamstring Stretch with Strap  - 1 x daily - 7 x weekly - 1 sets - 3 reps - 30 hold - Standing Bilateral Low Shoulder Row with Anchored Resistance  - 1 x daily - 7 x weekly - 2 sets - 10 reps - Supine Shoulder Horizontal Abduction with Resistance  - 1 x daily - 7 x weekly - 2 sets - 10 reps - Sit to Stand Without Arm Support  - 2 x daily - 7 x weekly - 2 sets - 5 reps - Seated Long Arc Quad  - 2 x daily - 7 x weekly - 2 sets - 10 reps - Seated Hamstring Stretch  - 2 x daily - 7 x weekly - 1 sets - 3 reps - 20 hold - Seated Piriformis Stretch with Trunk Bend  - 2 x daily - 7 x weekly - 1 sets - 10 reps - 20 hold - Supine Bilateral Shoulder External Rotation with Resistance  -  1 x daily - 7 x weekly - 2 sets - 10 reps - Supine PNF D2 Flexion with Resistance  - 1 x daily - 7 x weekly - 2 sets - 10 reps - Standing Hip Abduction with Counter Support  - 1 x daily - 7 x weekly - 2 sets - 5 reps - Standing Hip Extension with Counter Support  - 1 x daily - 7 x weekly - 2 sets - 5 reps - Heel Raises with Counter Support  - 1 x daily - 7 x weekly - 2 sets - 10 reps - Standing Shoulder Extension with Resistance  - 1 x daily - 7 x weekly - 2 sets - 10 reps ASSESSMENT:  CLINICAL IMPRESSION: Pt reports that she has been doing her HEP and it is going well.    PT added additional postural strength exercises and LE strength at counter top to HEP with good return of demo.  Pt has started to adjust how she approaches yard work to be more compliant for spinal health with osteoporosis.  ERO next visit with anticipation of more PT due to Pt traveling for  good portion of initial plan of care.   OBJECTIVE IMPAIRMENTS decreased balance, decreased mobility, decreased ROM, decreased strength, hypomobility, impaired flexibility, improper body mechanics, postural dysfunction, and pain.   ACTIVITY LIMITATIONS bending, sitting, squatting, and transfers  PARTICIPATION LIMITATIONS: cleaning, laundry, community activity, and yard work  PERSONAL FACTORS Age, Time since onset of injury/illness/exacerbation, and 1-2 comorbidities: history of falls, history of peripheral fractures, osteoporosis  are also affecting patient's functional outcome.   REHAB POTENTIAL: Excellent  CLINICAL DECISION MAKING: Stable/uncomplicated  EVALUATION COMPLEXITY: Low   GOALS: Goals reviewed with patient? Yes  SHORT TERM GOALS: Target date: 11/17/21  Pt will be ind with initial HEP Baseline: Goal status: INITIAL  2.  Pt will understand and adjust daily activities and exercises around dos and don'ts for osteoporosis.   Baseline:  Goal status: INITIAL    LONG TERM GOALS: Target date: 12/15/21  Pt will be ind with advanced HEP to optimize mobility, strength and functional mobility in ways that are safe for her spine secondary to osteoporosis Baseline:  Goal status: INITIAL  2.  Pt will achieve at least 65 deg bil hamstring length to reduce threat of LBP with movement Baseline:  Goal status: INITIAL  3.  Pt will demo proper body mechanics for squat/lift and household tasks such as vacuuming for spine health.  Baseline:  Goal status: INITIAL  4. Pt will achieve at least 4+/5 for back extensors, scapular stabilizers and bil LE for improved posture with functional task performance. Baseline:  Goal status: INITIAL     PLAN: PT FREQUENCY: 2x/week  PT DURATION: 8 weeks (Pt will be out of town for first 3 weeks of certification period, goal is 8 visits)  PLANNED INTERVENTIONS: Therapeutic exercises, Therapeutic activity, Neuromuscular re-education, Balance  training, and Self Care.  PLAN FOR NEXT SESSION: ERO with extension, review new HEP, continue strength and posture progression.  Add core to [employer], PT 12/08/21 10:15 AM

## 2021-12-10 ENCOUNTER — Ambulatory Visit: Payer: Medicare PPO | Admitting: Physical Therapy

## 2021-12-15 ENCOUNTER — Ambulatory Visit: Payer: Medicare PPO | Admitting: Physical Therapy

## 2021-12-15 ENCOUNTER — Encounter: Payer: Self-pay | Admitting: Physical Therapy

## 2021-12-15 DIAGNOSIS — M5459 Other low back pain: Secondary | ICD-10-CM

## 2021-12-15 DIAGNOSIS — R293 Abnormal posture: Secondary | ICD-10-CM

## 2021-12-15 NOTE — Therapy (Signed)
OUTPATIENT PHYSICAL THERAPY TREATMENT   Patient Name: Jennifer Heath MRN: [id number] DOB:September 13, 1950, 71 y.o., female Today's Date: 12/15/2021   PT End of Session - 12/15/21 0932     Visit Number [place] [employer], aproved 8 visits 8/22 - 10/17    Authorization - Visit Number 5    Authorization - Number of Visits 8    Progress Note Due on Visit 10    PT Start Time 0932    Activity Tolerance Patient tolerated treatment well    Behavior During Therapy [facility] for tasks assessed/performed                 Past Medical History:  Diagnosis Date   ADD (attention deficit disorder)    Depression    Hyperlipidemia    Neuropathy    Osteoporosis    Past Surgical History:  Procedure Laterality Date   COLONOSCOPY  04/01/2017   INNER EAR SURGERY  01/26/2016   TONSILLECTOMY     TOOTH EXTRACTION  04/04/2017   Patient Active Problem List   Diagnosis Date Noted   Small fiber neuropathy 05/19/2016   Paresthesia 02/16/2016   Depression 08/19/2015   Generalized anxiety disorder 08/19/2015   Memory changes 08/19/2015   ADD (attention deficit disorder) 08/19/2015    PCP: Dr. Carol Ada, MD  REFERRING PROVIDER: Wandra Feinstein, MD  REFERRING DIAG: osteoporosis  Rationale for Evaluation and Treatment Rehabilitation  THERAPY DIAG:  Abnormal posture  Other low back pain  ONSET DATE: 1.5 years ago osteopenia progressed to osteoporosis in bil hips and spine  SUBJECTIVE:                                                                                                                                                                                           SUBJECTIVE STATEMENT: I continue to modify how I operate with my daily activities around [employer] and don'ts of osteoporosis.  I hate the hamstring stretch but I know I need it.  I cramped in the hips with the standing hip abd exercise.  I feel like I need more time within the current exercises but may feel  like I need a tune up over time.    PERTINENT HISTORY:  Neuropathy well controlled with diet changes falls  PAIN:  Are you having pain? No  PRECAUTIONS: Other: falls  WEIGHT BEARING RESTRICTIONS No  FALLS:  Has patient fallen in last 6 months? No, did have several falls 10 years ago with peripheral falls, no compression fractures to date  LIVING ENVIRONMENT: Lives with: lives with their family and lives alone Lives in: House/apartment Has  following equipment at home: None  OCCUPATION: retired  PLOF: Independent  PATIENT GOALS wants to start an exercise program and understand if yoga is safe   OBJECTIVE:   DIAGNOSTIC FINDINGS:  Dexa scan: osteoporosis in bil hips and spine  PATIENT SURVEYS:  12/15/21: Modified Oswestry  4/50  Eval: Modified Oswestry 7/50, mild disability   SCREENING FOR RED FLAGS: Bowel or bladder incontinence: No Spinal tumors: No Cauda equina syndrome: No Compression fracture: No Abdominal aneurysm: No  COGNITION:  Overall cognitive status: Within functional limits for tasks assessed     SENSATION: WFL  MUSCLE LENGTH: 10/17: Hamstrings: Right 60, Left 70 deg Adductors WFL bil  Eval:  Hamstrings: Right 45 deg; Left 50 deg Quadriceps 75% bil  POSTURE: increased thoracic kyphosis, Lt shoulder elevated  PALPATION: Non-tender  LUMBAR ROM:   Active  A/PROM  eval A/ROM 10/17  Flexion Fingers to midshin Fingers to midshin, good use of hip hinge with straight back   Extension 10 15  Right lateral flexion 10 15  Left lateral flexion 10 15  Right rotation 50% 75%  Left rotation 50% 50%   (Blank rows = not tested)  LOWER EXTREMITY ROM:     Passive  Right eval Left eval Right 10/17 Left 10/17  Hip flexion full full    Hip extension full full    Hip abduction 35 tight adductors 35 tight adductors 45 50  Hip adduction      Hip internal rotation 12 12 full full  Hip external rotation 65 65 full full  Knee flexion      Knee  extension      Ankle dorsiflexion      Ankle plantarflexion      Ankle inversion      Ankle eversion       (Blank rows = not tested)  LOWER EXTREMITY MMT:   10/17:   LE strength 4+/5 to 5/5 4+/5 scapular stabilizers 4+/5 trunk extensors   Eval: 4/5 bil LE globally 4/5 scapular stabilizers bil 4/5 trunk extensors   FUNCTIONAL TESTS:  SLS: mild-mod instability  bil, Rt>Lt  GAIT: Distance walked: within clinic Assistive device utilized: None Level of assistance: Complete Independence Comments: lack of Lt arm swing    TODAY'S TREATMENT  Date: 12/15/21 Update measures and goals Verbal and physical review of HEP, progressed sit to stand to squat to chair Gave green band for progression of UE therex when ready  Date: 12/08/21 NuStep L5 x 5' PT present to discuss progress Seated hamstring stretch 2x30" with VC and demo to keep back straight Supine shoulder horizontal abduction red band 2x10 Supine diagonal draw the sword 2x10 red band Hip adductor stretch with strap x20 seconds  Sit to stand 2x10 Long arc quads 5" hold x10 Standing row: red band 2x10 Counter hip abd and ext 2x5 each bil Counter heel raises bil 2x10    Date:  12/03/21 Arm bike: Level 1 x 6 minutes -PT present to discuss progress  Supine hamstring stretch with strap: 2x30 seconds bil each Hip adductor stretch with strap x20 seconds  Supine shoulder horizontal abduction red band 2x10 Sit to stand 2x10 Long arc quads 5" hold x10 Standing row: red band 2x10 Date:  12/01/21 Discussion regarding body mechanics modifications and how to make changes to current movement patterns. Answered pt questions. Supine hamstring stretch with strap: 2x30 seconds bil each Hip adductor stretch with strap x20 seconds  Supine shoulder horizontal abduction red band 2x10 Sit to stand 2x10 Long arc  quads 5" hold x10 Standing row: red band 2x10                                              DO's and DON'T's  Avoid and/or  Minimize positions of forward bending ( flexion) Side bending and rotation of the trunk Especially when movements occur together   When your back aches:  Don't sit down  Lie down on your back with a small pillow under your head and one under your knees or as outlined by our therapist. Or, lie in the 90/90 position ( on the floor with your feet and legs on the sofa with knees and hips bent to 90 degrees)  Tying or putting on your shoes:  Don't bend over to tie your shoes or put on socks. Instead, bring one foot up, cross it over the opposite knee and bend forward (hinge) at the hips to so the task.  Keep your back straight.  If you cannot do this safely, then you need to use long handled assistive devices such as a shoehorn and sock puller.  Exercising: Don't engage in ballistic types of exercise routines such as high-impact aerobics or jumping rope Don't do exercises in the gym that bring you forward (abdominal crunches, sit-ups, touching your  toes, knee-to-chest, straight leg raising.) Follow a regular exercise program that includes a variety of different weight-bearing activities, such as low-impact aerobics, T' ai chi or walking as your physical therapist advises Do exercises that emphasize return to normal body alignment and strengthening of the muscles that keep your back straight, as outlined in this program or by your therapist  Household tasks: Don't reach unnecessarily or twist your trunk when mopping, sweeping, vacuuming, raking, making beds, weeding gardens, getting objects ou of cupboards, etc. Keep your broom, mop, vacuum, or rake close to you and mover your whole body as you move them. Walk over to the area on which you are working. Arrange kitchen, bathroom, and bedroom shelves so that frequently used items may be reached without excessive bending, twisting, and reaching.  Use a sturdy stool if necessary. Don't bend from the waist to pick up something up  Off the floor, out of  the trunk of your car, or to brush your teeth, wash your face, etc.  Bend at the knees, keeping back straight as possible. Use a reacher if necessary.   Prevention of fracture is the so-called "BOTTOm -Line" in the management of OSTEOPOROSIS. Do not take unnecessary chances in movement. Once a compression fracture occurs, the process is very difficult to control; one fracture is frequently followed by many more.     PATIENT EDUCATION:  Education details: Access Code: [email or web address] Person educated: Patient Education method: Consulting civil engineer, Demonstration, Verbal cues, and Handouts Education comprehension: verbalized understanding and returned demonstration   HOME EXERCISE PROGRAM: Access Code: [email or web address] URL: [email or web address] Date: 12/15/2021 Prepared by: Venetia Night Darielys Giglia  Exercises - Supine Hamstring Stretch with Strap  - 1 x daily - 7 x weekly - 1 sets - 3 reps - 30 hold - Supine Hamstring Stretch  - 1 x daily - 7 x weekly - 1 sets - 3 reps - 30 hold - Hip Adductors and Hamstring Stretch with Strap  - 1 x daily - 7 x weekly - 1 sets - 3 reps - 30 hold - Standing Bilateral Low Shoulder Row with Anchored Resistance  -  1 x daily - 7 x weekly - 2 sets - 10 reps - Supine Shoulder Horizontal Abduction with Resistance  - 1 x daily - 7 x weekly - 2 sets - 10 reps - Sit to Stand Without Arm Support  - 2 x daily - 7 x weekly - 2 sets - 5 reps - Seated Long Arc Quad  - 2 x daily - 7 x weekly - 2 sets - 10 reps - Seated Hamstring Stretch  - 2 x daily - 7 x weekly - 1 sets - 3 reps - 20 hold - Seated Piriformis Stretch with Trunk Bend  - 2 x daily - 7 x weekly - 1 sets - 10 reps - 20 hold - Supine Bilateral Shoulder External Rotation with Resistance  - 1 x daily - 7 x weekly - 2 sets - 10 reps - Supine PNF D2 Flexion with Resistance  - 1 x daily - 7 x weekly - 2 sets - 10 reps - Standing Hip Abduction with Counter Support  - 1 x daily - 7 x weekly - 2 sets - 5 reps - Standing Hip  Extension with Counter Support  - 1 x daily - 7 x weekly - 2 sets - 5 reps - Heel Raises with Counter Support  - 1 x daily - 7 x weekly - 2 sets - 10 reps - Standing Shoulder Extension with Resistance  - 1 x daily - 7 x weekly - 2 sets - 10 reps - Mini Squat with Chair  - 1 x daily - 7 x weekly - 3 sets - 10 reps ASSESSMENT:  CLINICAL IMPRESSION: Pt with much improved LE flexibility, hip ROM and knowledge of body mechanics for spine safety.  Session spent reviewing HEP and progressed red to green tband for UE therex and progressed sit to stand to squat to chair.  Pt has modified body mechanics for house and yard work to comply with [employer] and don'ts of osteoporosis.  She is ready for d/c to HEP.  OBJECTIVE IMPAIRMENTS decreased balance, decreased mobility, decreased ROM, decreased strength, hypomobility, impaired flexibility, improper body mechanics, postural dysfunction, and pain.   ACTIVITY LIMITATIONS bending, sitting, squatting, and transfers  PARTICIPATION LIMITATIONS: cleaning, laundry, community activity, and yard work  PERSONAL FACTORS Age, Time since onset of injury/illness/exacerbation, and 1-2 comorbidities: history of falls, history of peripheral fractures, osteoporosis  are also affecting patient's functional outcome.   REHAB POTENTIAL: Excellent  CLINICAL DECISION MAKING: Stable/uncomplicated  EVALUATION COMPLEXITY: Low   GOALS: Goals reviewed with patient? Yes  SHORT TERM GOALS: Target date: 11/17/21  Pt will be ind with initial HEP Baseline: Goal status: met  2.  Pt will understand and adjust daily activities and exercises around dos and don'ts for osteoporosis.   Baseline:  Goal status: met    LONG TERM GOALS: Target date: 12/15/21  Pt will be ind with advanced HEP to optimize mobility, strength and functional mobility in ways that are safe for her spine secondary to osteoporosis Baseline:  Goal status: met  2.  Pt will achieve at least 65 deg bil hamstring  length to reduce threat of LBP with movement Baseline:  Goal status: met for Lt, 60 deg for Rt, partially met  3.  Pt will demo proper body mechanics for squat/lift and household tasks such as vacuuming for spine health.  Baseline:  Goal status: met  4. Pt will achieve at least 4+/5 for back extensors, scapular stabilizers and bil LE for improved posture with functional  task performance. Baseline:  Goal status:  met     PLAN: PT FREQUENCY: 2x/week  PT DURATION: 8 weeks (Pt will be out of town for first 3 weeks of certification period, goal is 8 visits)  PLANNED INTERVENTIONS: Therapeutic exercises, Therapeutic activity, Neuromuscular re-education, Balance training, and Self Care.  PLAN FOR NEXT SESSION: d/c to HEP   PHYSICAL THERAPY DISCHARGE SUMMARY  Visits from Start of Care: 5  Current functional level related to goals / functional outcomes: All goals met   Remaining deficits: See above   Education / Equipment: HEP   Patient agrees to discharge. Patient goals were met. Patient is being discharged due to meeting the stated rehab goals.    Railey Glad, PT 12/15/21 10:56 AM

## 2021-12-23 DIAGNOSIS — R110 Nausea: Secondary | ICD-10-CM | POA: Diagnosis not present

## 2021-12-23 DIAGNOSIS — R051 Acute cough: Secondary | ICD-10-CM | POA: Diagnosis not present

## 2021-12-23 DIAGNOSIS — R5383 Other fatigue: Secondary | ICD-10-CM | POA: Diagnosis not present

## 2021-12-23 DIAGNOSIS — R509 Fever, unspecified: Secondary | ICD-10-CM | POA: Diagnosis not present

## 2021-12-23 DIAGNOSIS — R059 Cough, unspecified: Secondary | ICD-10-CM | POA: Diagnosis not present

## 2021-12-23 DIAGNOSIS — R0981 Nasal congestion: Secondary | ICD-10-CM | POA: Diagnosis not present

## 2021-12-23 DIAGNOSIS — Z03818 Encounter for observation for suspected exposure to other biological agents ruled out: Secondary | ICD-10-CM | POA: Diagnosis not present

## 2022-02-04 DIAGNOSIS — M546 Pain in thoracic spine: Secondary | ICD-10-CM | POA: Diagnosis not present

## 2022-02-04 DIAGNOSIS — E559 Vitamin D deficiency, unspecified: Secondary | ICD-10-CM | POA: Diagnosis not present

## 2022-02-04 DIAGNOSIS — R5383 Other fatigue: Secondary | ICD-10-CM | POA: Diagnosis not present

## 2022-02-04 DIAGNOSIS — M810 Age-related osteoporosis without current pathological fracture: Secondary | ICD-10-CM | POA: Diagnosis not present

## 2022-04-22 DIAGNOSIS — N644 Mastodynia: Secondary | ICD-10-CM | POA: Diagnosis not present

## 2022-04-22 DIAGNOSIS — R928 Other abnormal and inconclusive findings on diagnostic imaging of breast: Secondary | ICD-10-CM | POA: Diagnosis not present

## 2022-05-25 DIAGNOSIS — E559 Vitamin D deficiency, unspecified: Secondary | ICD-10-CM | POA: Diagnosis not present

## 2022-05-25 DIAGNOSIS — H02402 Unspecified ptosis of left eyelid: Secondary | ICD-10-CM | POA: Diagnosis not present

## 2022-05-25 DIAGNOSIS — Z0000 Encounter for general adult medical examination without abnormal findings: Secondary | ICD-10-CM | POA: Diagnosis not present

## 2022-05-25 DIAGNOSIS — E785 Hyperlipidemia, unspecified: Secondary | ICD-10-CM | POA: Diagnosis not present

## 2022-05-25 DIAGNOSIS — F900 Attention-deficit hyperactivity disorder, predominantly inattentive type: Secondary | ICD-10-CM | POA: Diagnosis not present

## 2022-05-25 DIAGNOSIS — M810 Age-related osteoporosis without current pathological fracture: Secondary | ICD-10-CM | POA: Diagnosis not present

## 2022-05-25 DIAGNOSIS — Z1331 Encounter for screening for depression: Secondary | ICD-10-CM | POA: Diagnosis not present

## 2022-06-30 DIAGNOSIS — R509 Fever, unspecified: Secondary | ICD-10-CM | POA: Diagnosis not present

## 2022-06-30 DIAGNOSIS — R051 Acute cough: Secondary | ICD-10-CM | POA: Diagnosis not present

## 2022-06-30 DIAGNOSIS — Z03818 Encounter for observation for suspected exposure to other biological agents ruled out: Secondary | ICD-10-CM | POA: Diagnosis not present

## 2022-06-30 DIAGNOSIS — R0609 Other forms of dyspnea: Secondary | ICD-10-CM | POA: Diagnosis not present

## 2022-06-30 DIAGNOSIS — J029 Acute pharyngitis, unspecified: Secondary | ICD-10-CM | POA: Diagnosis not present

## 2022-08-03 DIAGNOSIS — H35371 Puckering of macula, right eye: Secondary | ICD-10-CM | POA: Diagnosis not present

## 2022-08-04 DIAGNOSIS — M79675 Pain in left toe(s): Secondary | ICD-10-CM | POA: Diagnosis not present

## 2022-08-16 DIAGNOSIS — R5383 Other fatigue: Secondary | ICD-10-CM | POA: Diagnosis not present

## 2022-08-16 DIAGNOSIS — R0609 Other forms of dyspnea: Secondary | ICD-10-CM | POA: Diagnosis not present

## 2022-08-19 DIAGNOSIS — M810 Age-related osteoporosis without current pathological fracture: Secondary | ICD-10-CM | POA: Diagnosis not present

## 2022-08-20 DIAGNOSIS — R0609 Other forms of dyspnea: Secondary | ICD-10-CM | POA: Diagnosis not present

## 2022-08-26 DIAGNOSIS — S61011A Laceration without foreign body of right thumb without damage to nail, initial encounter: Secondary | ICD-10-CM | POA: Diagnosis not present

## 2022-09-17 DIAGNOSIS — S61011D Laceration without foreign body of right thumb without damage to nail, subsequent encounter: Secondary | ICD-10-CM | POA: Diagnosis not present

## 2022-09-17 DIAGNOSIS — T1490XD Injury, unspecified, subsequent encounter: Secondary | ICD-10-CM | POA: Diagnosis not present

## 2022-10-26 DIAGNOSIS — Z86018 Personal history of other benign neoplasm: Secondary | ICD-10-CM | POA: Diagnosis not present

## 2022-10-26 DIAGNOSIS — Z808 Family history of malignant neoplasm of other organs or systems: Secondary | ICD-10-CM | POA: Diagnosis not present

## 2022-10-26 DIAGNOSIS — L821 Other seborrheic keratosis: Secondary | ICD-10-CM | POA: Diagnosis not present

## 2022-10-26 DIAGNOSIS — L720 Epidermal cyst: Secondary | ICD-10-CM | POA: Diagnosis not present

## 2022-10-26 DIAGNOSIS — L719 Rosacea, unspecified: Secondary | ICD-10-CM | POA: Diagnosis not present

## 2022-10-26 DIAGNOSIS — L578 Other skin changes due to chronic exposure to nonionizing radiation: Secondary | ICD-10-CM | POA: Diagnosis not present

## 2022-10-26 DIAGNOSIS — L570 Actinic keratosis: Secondary | ICD-10-CM | POA: Diagnosis not present

## 2022-10-26 DIAGNOSIS — D225 Melanocytic nevi of trunk: Secondary | ICD-10-CM | POA: Diagnosis not present

## 2022-11-29 DIAGNOSIS — F900 Attention-deficit hyperactivity disorder, predominantly inattentive type: Secondary | ICD-10-CM | POA: Diagnosis not present

## 2022-11-29 DIAGNOSIS — E785 Hyperlipidemia, unspecified: Secondary | ICD-10-CM | POA: Diagnosis not present

## 2022-11-29 DIAGNOSIS — M810 Age-related osteoporosis without current pathological fracture: Secondary | ICD-10-CM | POA: Diagnosis not present

## 2023-01-13 DIAGNOSIS — M810 Age-related osteoporosis without current pathological fracture: Secondary | ICD-10-CM | POA: Diagnosis not present

## 2023-01-13 DIAGNOSIS — M25551 Pain in right hip: Secondary | ICD-10-CM | POA: Diagnosis not present

## 2023-01-13 DIAGNOSIS — M533 Sacrococcygeal disorders, not elsewhere classified: Secondary | ICD-10-CM | POA: Diagnosis not present

## 2023-01-20 DIAGNOSIS — Z713 Dietary counseling and surveillance: Secondary | ICD-10-CM | POA: Diagnosis not present

## 2023-01-20 DIAGNOSIS — E559 Vitamin D deficiency, unspecified: Secondary | ICD-10-CM | POA: Diagnosis not present

## 2023-01-20 DIAGNOSIS — E785 Hyperlipidemia, unspecified: Secondary | ICD-10-CM | POA: Diagnosis not present

## 2023-04-05 DIAGNOSIS — Z1231 Encounter for screening mammogram for malignant neoplasm of breast: Secondary | ICD-10-CM | POA: Diagnosis not present

## 2023-04-05 DIAGNOSIS — M810 Age-related osteoporosis without current pathological fracture: Secondary | ICD-10-CM | POA: Diagnosis not present

## 2023-04-05 DIAGNOSIS — M8588 Other specified disorders of bone density and structure, other site: Secondary | ICD-10-CM | POA: Diagnosis not present

## 2023-05-05 DIAGNOSIS — F900 Attention-deficit hyperactivity disorder, predominantly inattentive type: Secondary | ICD-10-CM | POA: Diagnosis not present

## 2023-05-05 DIAGNOSIS — H1013 Acute atopic conjunctivitis, bilateral: Secondary | ICD-10-CM | POA: Diagnosis not present

## 2023-06-07 DIAGNOSIS — M810 Age-related osteoporosis without current pathological fracture: Secondary | ICD-10-CM | POA: Diagnosis not present

## 2023-06-07 DIAGNOSIS — Z0000 Encounter for general adult medical examination without abnormal findings: Secondary | ICD-10-CM | POA: Diagnosis not present

## 2023-06-07 DIAGNOSIS — R21 Rash and other nonspecific skin eruption: Secondary | ICD-10-CM | POA: Diagnosis not present

## 2023-06-07 DIAGNOSIS — Z1331 Encounter for screening for depression: Secondary | ICD-10-CM | POA: Diagnosis not present

## 2023-06-07 DIAGNOSIS — E785 Hyperlipidemia, unspecified: Secondary | ICD-10-CM | POA: Diagnosis not present

## 2023-06-07 DIAGNOSIS — F900 Attention-deficit hyperactivity disorder, predominantly inattentive type: Secondary | ICD-10-CM | POA: Diagnosis not present

## 2023-06-07 DIAGNOSIS — Z23 Encounter for immunization: Secondary | ICD-10-CM | POA: Diagnosis not present

## 2023-07-14 DIAGNOSIS — M810 Age-related osteoporosis without current pathological fracture: Secondary | ICD-10-CM | POA: Diagnosis not present

## 2023-07-14 DIAGNOSIS — E559 Vitamin D deficiency, unspecified: Secondary | ICD-10-CM | POA: Diagnosis not present

## 2023-08-17 DIAGNOSIS — E785 Hyperlipidemia, unspecified: Secondary | ICD-10-CM | POA: Diagnosis not present

## 2023-08-17 DIAGNOSIS — M810 Age-related osteoporosis without current pathological fracture: Secondary | ICD-10-CM | POA: Diagnosis not present

## 2023-09-07 DIAGNOSIS — R770 Abnormality of albumin: Secondary | ICD-10-CM | POA: Diagnosis not present

## 2023-09-07 DIAGNOSIS — E559 Vitamin D deficiency, unspecified: Secondary | ICD-10-CM | POA: Diagnosis not present

## 2023-09-13 DIAGNOSIS — E559 Vitamin D deficiency, unspecified: Secondary | ICD-10-CM | POA: Diagnosis not present

## 2023-09-13 DIAGNOSIS — M810 Age-related osteoporosis without current pathological fracture: Secondary | ICD-10-CM | POA: Diagnosis not present

## 2023-10-26 DIAGNOSIS — M810 Age-related osteoporosis without current pathological fracture: Secondary | ICD-10-CM | POA: Diagnosis not present

## 2023-11-01 DIAGNOSIS — Z86018 Personal history of other benign neoplasm: Secondary | ICD-10-CM | POA: Diagnosis not present

## 2023-11-01 DIAGNOSIS — L658 Other specified nonscarring hair loss: Secondary | ICD-10-CM | POA: Diagnosis not present

## 2023-11-01 DIAGNOSIS — Z808 Family history of malignant neoplasm of other organs or systems: Secondary | ICD-10-CM | POA: Diagnosis not present

## 2023-11-01 DIAGNOSIS — D225 Melanocytic nevi of trunk: Secondary | ICD-10-CM | POA: Diagnosis not present

## 2023-11-01 DIAGNOSIS — L720 Epidermal cyst: Secondary | ICD-10-CM | POA: Diagnosis not present

## 2023-11-01 DIAGNOSIS — L719 Rosacea, unspecified: Secondary | ICD-10-CM | POA: Diagnosis not present

## 2023-11-01 DIAGNOSIS — L821 Other seborrheic keratosis: Secondary | ICD-10-CM | POA: Diagnosis not present

## 2023-11-01 DIAGNOSIS — L578 Other skin changes due to chronic exposure to nonionizing radiation: Secondary | ICD-10-CM | POA: Diagnosis not present

## 2023-11-21 DIAGNOSIS — H25813 Combined forms of age-related cataract, bilateral: Secondary | ICD-10-CM | POA: Diagnosis not present

## 2023-11-21 DIAGNOSIS — H43813 Vitreous degeneration, bilateral: Secondary | ICD-10-CM | POA: Diagnosis not present

## 2023-11-21 DIAGNOSIS — H33011 Retinal detachment with single break, right eye: Secondary | ICD-10-CM | POA: Diagnosis not present

## 2023-11-22 DIAGNOSIS — H3321 Serous retinal detachment, right eye: Secondary | ICD-10-CM | POA: Diagnosis not present

## 2023-11-22 DIAGNOSIS — H33012 Retinal detachment with single break, left eye: Secondary | ICD-10-CM | POA: Diagnosis not present

## 2023-11-22 DIAGNOSIS — F17210 Nicotine dependence, cigarettes, uncomplicated: Secondary | ICD-10-CM | POA: Diagnosis not present

## 2023-11-22 DIAGNOSIS — Z87891 Personal history of nicotine dependence: Secondary | ICD-10-CM | POA: Diagnosis not present

## 2023-11-22 DIAGNOSIS — H33011 Retinal detachment with single break, right eye: Secondary | ICD-10-CM | POA: Diagnosis not present

## 2023-11-22 DIAGNOSIS — H33001 Unspecified retinal detachment with retinal break, right eye: Secondary | ICD-10-CM | POA: Diagnosis not present

## 2023-12-07 DIAGNOSIS — E785 Hyperlipidemia, unspecified: Secondary | ICD-10-CM | POA: Diagnosis not present

## 2023-12-07 DIAGNOSIS — M810 Age-related osteoporosis without current pathological fracture: Secondary | ICD-10-CM | POA: Diagnosis not present

## 2023-12-07 DIAGNOSIS — F900 Attention-deficit hyperactivity disorder, predominantly inattentive type: Secondary | ICD-10-CM | POA: Diagnosis not present

## 2023-12-07 DIAGNOSIS — Z23 Encounter for immunization: Secondary | ICD-10-CM | POA: Diagnosis not present

## 2023-12-07 DIAGNOSIS — G629 Polyneuropathy, unspecified: Secondary | ICD-10-CM | POA: Diagnosis not present

## 2023-12-15 DIAGNOSIS — H43813 Vitreous degeneration, bilateral: Secondary | ICD-10-CM | POA: Diagnosis not present

## 2023-12-15 DIAGNOSIS — H33011 Retinal detachment with single break, right eye: Secondary | ICD-10-CM | POA: Diagnosis not present

## 2023-12-15 DIAGNOSIS — Z9889 Other specified postprocedural states: Secondary | ICD-10-CM | POA: Diagnosis not present

## 2023-12-29 DIAGNOSIS — H43813 Vitreous degeneration, bilateral: Secondary | ICD-10-CM | POA: Diagnosis not present

## 2023-12-29 DIAGNOSIS — Z9889 Other specified postprocedural states: Secondary | ICD-10-CM | POA: Diagnosis not present

## 2023-12-29 DIAGNOSIS — H33011 Retinal detachment with single break, right eye: Secondary | ICD-10-CM | POA: Diagnosis not present

## 2024-01-30 DIAGNOSIS — H33011 Retinal detachment with single break, right eye: Secondary | ICD-10-CM | POA: Diagnosis not present
